# Patient Record
Sex: Male | Born: 1945
Health system: Southern US, Community
[De-identification: ages and names within clinical notes are randomized; demographics above are authoritative.]

## PROBLEM LIST (undated history)

## (undated) DIAGNOSIS — Z6828 Body mass index (BMI) 28.0-28.9, adult: Secondary | ICD-10-CM

## (undated) DIAGNOSIS — M109 Gout, unspecified: Secondary | ICD-10-CM

## (undated) DIAGNOSIS — I10 Essential (primary) hypertension: Secondary | ICD-10-CM

## (undated) DIAGNOSIS — E785 Hyperlipidemia, unspecified: Secondary | ICD-10-CM

## (undated) HISTORY — PX: KNEE SURGERY: SHX244

## (undated) HISTORY — DX: Essential (primary) hypertension: I10

## (undated) HISTORY — PX: OTHER SURGICAL HISTORY: SHX169

## (undated) HISTORY — DX: Body mass index (BMI) 28.0-28.9, adult: Z68.28

## (undated) HISTORY — DX: Hyperlipidemia, unspecified: E78.5

## (undated) HISTORY — DX: Gout, unspecified: M10.9

---

## 1998-12-12 ENCOUNTER — Encounter: Payer: Self-pay | Admitting: Emergency Medicine

## 1998-12-12 ENCOUNTER — Emergency Department (HOSPITAL_COMMUNITY): Admission: EM | Admit: 1998-12-12 | Discharge: 1998-12-12 | Payer: Self-pay | Admitting: Emergency Medicine

## 1999-09-02 ENCOUNTER — Emergency Department (HOSPITAL_COMMUNITY): Admission: EM | Admit: 1999-09-02 | Discharge: 1999-09-02 | Payer: Self-pay | Admitting: *Deleted

## 1999-09-08 ENCOUNTER — Emergency Department (HOSPITAL_COMMUNITY): Admission: EM | Admit: 1999-09-08 | Discharge: 1999-09-08 | Payer: Self-pay | Admitting: Emergency Medicine

## 2006-03-21 ENCOUNTER — Ambulatory Visit: Payer: Self-pay

## 2006-03-29 ENCOUNTER — Emergency Department (HOSPITAL_COMMUNITY): Admission: EM | Admit: 2006-03-29 | Discharge: 2006-03-30 | Payer: Self-pay | Admitting: Emergency Medicine

## 2008-11-01 ENCOUNTER — Emergency Department (HOSPITAL_COMMUNITY): Admission: EM | Admit: 2008-11-01 | Discharge: 2008-11-01 | Payer: Self-pay | Admitting: Family Medicine

## 2011-04-19 DIAGNOSIS — J0100 Acute maxillary sinusitis, unspecified: Secondary | ICD-10-CM | POA: Diagnosis not present

## 2011-07-20 DIAGNOSIS — H10029 Other mucopurulent conjunctivitis, unspecified eye: Secondary | ICD-10-CM | POA: Diagnosis not present

## 2011-07-20 DIAGNOSIS — H5710 Ocular pain, unspecified eye: Secondary | ICD-10-CM | POA: Diagnosis not present

## 2011-08-05 DIAGNOSIS — M25579 Pain in unspecified ankle and joints of unspecified foot: Secondary | ICD-10-CM | POA: Diagnosis not present

## 2011-08-05 DIAGNOSIS — R229 Localized swelling, mass and lump, unspecified: Secondary | ICD-10-CM | POA: Diagnosis not present

## 2011-08-05 DIAGNOSIS — M109 Gout, unspecified: Secondary | ICD-10-CM | POA: Diagnosis not present

## 2012-06-05 DIAGNOSIS — L820 Inflamed seborrheic keratosis: Secondary | ICD-10-CM | POA: Diagnosis not present

## 2012-06-05 DIAGNOSIS — L723 Sebaceous cyst: Secondary | ICD-10-CM | POA: Diagnosis not present

## 2013-04-07 DIAGNOSIS — S5000XA Contusion of unspecified elbow, initial encounter: Secondary | ICD-10-CM | POA: Diagnosis not present

## 2013-04-07 DIAGNOSIS — M7020 Olecranon bursitis, unspecified elbow: Secondary | ICD-10-CM | POA: Diagnosis not present

## 2013-04-07 DIAGNOSIS — IMO0002 Reserved for concepts with insufficient information to code with codable children: Secondary | ICD-10-CM | POA: Diagnosis not present

## 2013-04-09 DIAGNOSIS — M7020 Olecranon bursitis, unspecified elbow: Secondary | ICD-10-CM | POA: Diagnosis not present

## 2013-05-01 DIAGNOSIS — M109 Gout, unspecified: Secondary | ICD-10-CM | POA: Diagnosis not present

## 2013-06-02 DIAGNOSIS — R040 Epistaxis: Secondary | ICD-10-CM | POA: Diagnosis not present

## 2013-06-10 DIAGNOSIS — J019 Acute sinusitis, unspecified: Secondary | ICD-10-CM | POA: Diagnosis not present

## 2013-07-28 DIAGNOSIS — L0390 Cellulitis, unspecified: Secondary | ICD-10-CM | POA: Diagnosis not present

## 2013-07-28 DIAGNOSIS — M7020 Olecranon bursitis, unspecified elbow: Secondary | ICD-10-CM | POA: Diagnosis not present

## 2013-07-28 DIAGNOSIS — L0291 Cutaneous abscess, unspecified: Secondary | ICD-10-CM | POA: Diagnosis not present

## 2013-07-28 DIAGNOSIS — M109 Gout, unspecified: Secondary | ICD-10-CM | POA: Diagnosis not present

## 2013-07-30 DIAGNOSIS — L0291 Cutaneous abscess, unspecified: Secondary | ICD-10-CM | POA: Diagnosis not present

## 2013-07-30 DIAGNOSIS — L0390 Cellulitis, unspecified: Secondary | ICD-10-CM | POA: Diagnosis not present

## 2013-07-30 DIAGNOSIS — M109 Gout, unspecified: Secondary | ICD-10-CM | POA: Diagnosis not present

## 2013-09-24 DIAGNOSIS — M109 Gout, unspecified: Secondary | ICD-10-CM | POA: Diagnosis not present

## 2013-11-26 DIAGNOSIS — M109 Gout, unspecified: Secondary | ICD-10-CM | POA: Diagnosis not present

## 2013-11-30 DIAGNOSIS — M1000 Idiopathic gout, unspecified site: Secondary | ICD-10-CM | POA: Diagnosis not present

## 2013-11-30 DIAGNOSIS — Z23 Encounter for immunization: Secondary | ICD-10-CM | POA: Diagnosis not present

## 2013-11-30 DIAGNOSIS — M109 Gout, unspecified: Secondary | ICD-10-CM | POA: Diagnosis not present

## 2013-11-30 DIAGNOSIS — R739 Hyperglycemia, unspecified: Secondary | ICD-10-CM | POA: Diagnosis not present

## 2013-11-30 DIAGNOSIS — I10 Essential (primary) hypertension: Secondary | ICD-10-CM | POA: Diagnosis not present

## 2013-12-14 DIAGNOSIS — R7309 Other abnormal glucose: Secondary | ICD-10-CM | POA: Diagnosis not present

## 2013-12-14 DIAGNOSIS — I10 Essential (primary) hypertension: Secondary | ICD-10-CM | POA: Diagnosis not present

## 2013-12-14 DIAGNOSIS — M1000 Idiopathic gout, unspecified site: Secondary | ICD-10-CM | POA: Diagnosis not present

## 2014-01-26 DIAGNOSIS — I10 Essential (primary) hypertension: Secondary | ICD-10-CM | POA: Diagnosis not present

## 2014-01-26 DIAGNOSIS — M7020 Olecranon bursitis, unspecified elbow: Secondary | ICD-10-CM | POA: Diagnosis not present

## 2014-01-26 DIAGNOSIS — R7989 Other specified abnormal findings of blood chemistry: Secondary | ICD-10-CM | POA: Diagnosis not present

## 2014-01-26 DIAGNOSIS — M109 Gout, unspecified: Secondary | ICD-10-CM | POA: Diagnosis not present

## 2014-04-05 DIAGNOSIS — N429 Disorder of prostate, unspecified: Secondary | ICD-10-CM | POA: Diagnosis not present

## 2014-04-05 DIAGNOSIS — I10 Essential (primary) hypertension: Secondary | ICD-10-CM | POA: Diagnosis not present

## 2014-04-05 DIAGNOSIS — R7989 Other specified abnormal findings of blood chemistry: Secondary | ICD-10-CM | POA: Diagnosis not present

## 2014-04-05 DIAGNOSIS — M109 Gout, unspecified: Secondary | ICD-10-CM | POA: Diagnosis not present

## 2014-04-05 DIAGNOSIS — Z136 Encounter for screening for cardiovascular disorders: Secondary | ICD-10-CM | POA: Diagnosis not present

## 2014-04-05 DIAGNOSIS — R739 Hyperglycemia, unspecified: Secondary | ICD-10-CM | POA: Diagnosis not present

## 2014-04-05 DIAGNOSIS — Z8042 Family history of malignant neoplasm of prostate: Secondary | ICD-10-CM | POA: Diagnosis not present

## 2014-04-06 DIAGNOSIS — M109 Gout, unspecified: Secondary | ICD-10-CM | POA: Diagnosis not present

## 2014-04-06 DIAGNOSIS — Z136 Encounter for screening for cardiovascular disorders: Secondary | ICD-10-CM | POA: Diagnosis not present

## 2014-04-06 DIAGNOSIS — I10 Essential (primary) hypertension: Secondary | ICD-10-CM | POA: Diagnosis not present

## 2014-04-06 DIAGNOSIS — E559 Vitamin D deficiency, unspecified: Secondary | ICD-10-CM | POA: Diagnosis not present

## 2014-07-06 DIAGNOSIS — M109 Gout, unspecified: Secondary | ICD-10-CM | POA: Diagnosis not present

## 2014-07-06 DIAGNOSIS — E559 Vitamin D deficiency, unspecified: Secondary | ICD-10-CM | POA: Diagnosis not present

## 2014-07-07 DIAGNOSIS — I10 Essential (primary) hypertension: Secondary | ICD-10-CM | POA: Diagnosis not present

## 2014-07-07 DIAGNOSIS — M109 Gout, unspecified: Secondary | ICD-10-CM | POA: Diagnosis not present

## 2014-07-07 DIAGNOSIS — E559 Vitamin D deficiency, unspecified: Secondary | ICD-10-CM | POA: Diagnosis not present

## 2014-10-05 DIAGNOSIS — E559 Vitamin D deficiency, unspecified: Secondary | ICD-10-CM | POA: Diagnosis not present

## 2014-10-06 DIAGNOSIS — R739 Hyperglycemia, unspecified: Secondary | ICD-10-CM | POA: Diagnosis not present

## 2014-10-06 DIAGNOSIS — M109 Gout, unspecified: Secondary | ICD-10-CM | POA: Diagnosis not present

## 2014-10-06 DIAGNOSIS — Z23 Encounter for immunization: Secondary | ICD-10-CM | POA: Diagnosis not present

## 2014-10-06 DIAGNOSIS — E559 Vitamin D deficiency, unspecified: Secondary | ICD-10-CM | POA: Diagnosis not present

## 2014-10-06 DIAGNOSIS — R635 Abnormal weight gain: Secondary | ICD-10-CM | POA: Diagnosis not present

## 2014-10-06 DIAGNOSIS — Z136 Encounter for screening for cardiovascular disorders: Secondary | ICD-10-CM | POA: Diagnosis not present

## 2014-10-06 DIAGNOSIS — I10 Essential (primary) hypertension: Secondary | ICD-10-CM | POA: Diagnosis not present

## 2014-12-07 DIAGNOSIS — R220 Localized swelling, mass and lump, head: Secondary | ICD-10-CM | POA: Diagnosis not present

## 2014-12-07 DIAGNOSIS — K119 Disease of salivary gland, unspecified: Secondary | ICD-10-CM | POA: Diagnosis not present

## 2014-12-08 DIAGNOSIS — R220 Localized swelling, mass and lump, head: Secondary | ICD-10-CM | POA: Diagnosis not present

## 2014-12-08 DIAGNOSIS — K119 Disease of salivary gland, unspecified: Secondary | ICD-10-CM | POA: Diagnosis not present

## 2014-12-28 DIAGNOSIS — M109 Gout, unspecified: Secondary | ICD-10-CM | POA: Diagnosis not present

## 2014-12-28 DIAGNOSIS — I10 Essential (primary) hypertension: Secondary | ICD-10-CM | POA: Diagnosis not present

## 2014-12-28 DIAGNOSIS — K219 Gastro-esophageal reflux disease without esophagitis: Secondary | ICD-10-CM | POA: Diagnosis not present

## 2014-12-28 DIAGNOSIS — K119 Disease of salivary gland, unspecified: Secondary | ICD-10-CM | POA: Diagnosis not present

## 2015-01-20 DIAGNOSIS — J069 Acute upper respiratory infection, unspecified: Secondary | ICD-10-CM | POA: Diagnosis not present

## 2015-01-27 DIAGNOSIS — K219 Gastro-esophageal reflux disease without esophagitis: Secondary | ICD-10-CM | POA: Diagnosis not present

## 2015-01-27 DIAGNOSIS — R220 Localized swelling, mass and lump, head: Secondary | ICD-10-CM | POA: Diagnosis not present

## 2015-01-27 DIAGNOSIS — I10 Essential (primary) hypertension: Secondary | ICD-10-CM | POA: Diagnosis not present

## 2015-04-06 DIAGNOSIS — I10 Essential (primary) hypertension: Secondary | ICD-10-CM | POA: Diagnosis not present

## 2015-04-06 DIAGNOSIS — M109 Gout, unspecified: Secondary | ICD-10-CM | POA: Diagnosis not present

## 2015-04-07 DIAGNOSIS — I10 Essential (primary) hypertension: Secondary | ICD-10-CM | POA: Diagnosis not present

## 2015-04-07 DIAGNOSIS — M25512 Pain in left shoulder: Secondary | ICD-10-CM | POA: Diagnosis not present

## 2015-04-07 DIAGNOSIS — M7542 Impingement syndrome of left shoulder: Secondary | ICD-10-CM | POA: Diagnosis not present

## 2015-04-07 DIAGNOSIS — M109 Gout, unspecified: Secondary | ICD-10-CM | POA: Diagnosis not present

## 2015-04-13 DIAGNOSIS — M7502 Adhesive capsulitis of left shoulder: Secondary | ICD-10-CM | POA: Diagnosis not present

## 2015-04-13 DIAGNOSIS — M25512 Pain in left shoulder: Secondary | ICD-10-CM | POA: Diagnosis not present

## 2015-04-14 DIAGNOSIS — M7502 Adhesive capsulitis of left shoulder: Secondary | ICD-10-CM | POA: Diagnosis not present

## 2015-04-18 DIAGNOSIS — E875 Hyperkalemia: Secondary | ICD-10-CM | POA: Diagnosis not present

## 2015-04-19 DIAGNOSIS — M7502 Adhesive capsulitis of left shoulder: Secondary | ICD-10-CM | POA: Diagnosis not present

## 2015-04-20 DIAGNOSIS — I10 Essential (primary) hypertension: Secondary | ICD-10-CM | POA: Diagnosis not present

## 2015-04-20 DIAGNOSIS — M1000 Idiopathic gout, unspecified site: Secondary | ICD-10-CM | POA: Diagnosis not present

## 2015-04-20 DIAGNOSIS — M25512 Pain in left shoulder: Secondary | ICD-10-CM | POA: Diagnosis not present

## 2015-04-22 DIAGNOSIS — M7502 Adhesive capsulitis of left shoulder: Secondary | ICD-10-CM | POA: Diagnosis not present

## 2015-05-10 DIAGNOSIS — M7502 Adhesive capsulitis of left shoulder: Secondary | ICD-10-CM | POA: Diagnosis not present

## 2015-05-10 DIAGNOSIS — M25512 Pain in left shoulder: Secondary | ICD-10-CM | POA: Diagnosis not present

## 2015-09-19 DIAGNOSIS — R7989 Other specified abnormal findings of blood chemistry: Secondary | ICD-10-CM | POA: Diagnosis not present

## 2015-09-19 DIAGNOSIS — I10 Essential (primary) hypertension: Secondary | ICD-10-CM | POA: Diagnosis not present

## 2015-09-19 DIAGNOSIS — M109 Gout, unspecified: Secondary | ICD-10-CM | POA: Diagnosis not present

## 2015-09-22 DIAGNOSIS — Z23 Encounter for immunization: Secondary | ICD-10-CM | POA: Diagnosis not present

## 2015-09-22 DIAGNOSIS — E559 Vitamin D deficiency, unspecified: Secondary | ICD-10-CM | POA: Diagnosis not present

## 2015-09-22 DIAGNOSIS — Z0000 Encounter for general adult medical examination without abnormal findings: Secondary | ICD-10-CM | POA: Diagnosis not present

## 2015-09-22 DIAGNOSIS — M109 Gout, unspecified: Secondary | ICD-10-CM | POA: Diagnosis not present

## 2015-09-22 DIAGNOSIS — N289 Disorder of kidney and ureter, unspecified: Secondary | ICD-10-CM | POA: Diagnosis not present

## 2015-09-22 DIAGNOSIS — I10 Essential (primary) hypertension: Secondary | ICD-10-CM | POA: Diagnosis not present

## 2015-09-23 ENCOUNTER — Encounter: Payer: Self-pay | Admitting: Gastroenterology

## 2015-09-26 ENCOUNTER — Ambulatory Visit (AMBULATORY_SURGERY_CENTER): Payer: Self-pay

## 2015-09-26 ENCOUNTER — Encounter: Payer: Self-pay | Admitting: Gastroenterology

## 2015-09-26 VITALS — Ht 61.0 in | Wt 199.2 lb

## 2015-09-26 DIAGNOSIS — Z1211 Encounter for screening for malignant neoplasm of colon: Secondary | ICD-10-CM

## 2015-09-26 MED ORDER — NA SULFATE-K SULFATE-MG SULF 17.5-3.13-1.6 GM/177ML PO SOLN: ORAL | 0 refills | Status: DC

## 2015-09-26 NOTE — Progress Notes (Signed)
Per pt, no allergies to soy or egg products.Pt not taking any weight loss meds or using  O2 at home. 

## 2015-10-05 DIAGNOSIS — I10 Essential (primary) hypertension: Secondary | ICD-10-CM | POA: Diagnosis not present

## 2015-10-07 DIAGNOSIS — N289 Disorder of kidney and ureter, unspecified: Secondary | ICD-10-CM | POA: Diagnosis not present

## 2015-10-07 DIAGNOSIS — E860 Dehydration: Secondary | ICD-10-CM | POA: Diagnosis not present

## 2015-10-07 DIAGNOSIS — R7989 Other specified abnormal findings of blood chemistry: Secondary | ICD-10-CM | POA: Diagnosis not present

## 2015-10-07 DIAGNOSIS — I10 Essential (primary) hypertension: Secondary | ICD-10-CM | POA: Diagnosis not present

## 2015-10-10 ENCOUNTER — Ambulatory Visit (AMBULATORY_SURGERY_CENTER): Payer: PPO | Admitting: Gastroenterology

## 2015-10-10 ENCOUNTER — Encounter: Payer: Self-pay | Admitting: Gastroenterology

## 2015-10-10 VITALS — BP 130/74 | HR 62 | Temp 98.0°F | Resp 11 | Ht 70.0 in | Wt 243.0 lb

## 2015-10-10 DIAGNOSIS — K635 Polyp of colon: Secondary | ICD-10-CM

## 2015-10-10 DIAGNOSIS — D125 Benign neoplasm of sigmoid colon: Secondary | ICD-10-CM

## 2015-10-10 DIAGNOSIS — Z1211 Encounter for screening for malignant neoplasm of colon: Secondary | ICD-10-CM

## 2015-10-10 DIAGNOSIS — I10 Essential (primary) hypertension: Secondary | ICD-10-CM | POA: Diagnosis not present

## 2015-10-10 MED ORDER — SODIUM CHLORIDE 0.9 % IV SOLN: 500.0000 mL | INTRAVENOUS | Status: DC

## 2015-10-10 NOTE — Progress Notes (Signed)
Called to room to assist during endoscopic procedure.  Patient ID and intended procedure confirmed with present staff. Received instructions for my participation in the procedure from the performing physician.  

## 2015-10-10 NOTE — Op Note (Signed)
[facility] Patient Name: Frank Foster Procedure Date: 10/10/2015 11:18 AM MRN: [id number] Endoscopist: Ladene Artist , MD Age: 70 Referring MD:  Date of Birth: [birth year] Gender: Male Account #: [id number] Procedure:                Colonoscopy Indications:              Screening for colorectal malignant neoplasm Medicines:                Monitored Anesthesia Care Procedure:                Pre-Anesthesia Assessment:                           - Prior to the procedure, a History and Physical                            was performed, and patient medications and                            allergies were reviewed. The patient's tolerance of                            previous anesthesia was also reviewed. The risks                            and benefits of the procedure and the sedation                            options and risks were discussed with the patient.                            All questions were answered, and informed consent                            was obtained. Prior Anticoagulants: The patient has                            taken no previous anticoagulant or antiplatelet                            agents. ASA Grade Assessment: II - A patient with                            mild systemic disease. After reviewing the risks                            and benefits, the patient was deemed in                            satisfactory condition to undergo the procedure.                           After obtaining informed consent, the colonoscope  was passed under direct vision. Throughout the                            procedure, the patient's blood pressure, pulse, and                            oxygen saturations were monitored continuously. The                            Model PCF-[id number] scope was introduced                            through the anus and advanced to the the cecum,                            identified by  appendiceal orifice and ileocecal                            valve. The ileocecal valve, appendiceal orifice,                            and rectum were photographed. The quality of the                            bowel preparation was good. The colonoscopy was                            performed without difficulty. The patient tolerated                            the procedure well. Scope In: 11:31:12 AM Scope Out: [id number] AM Scope Withdrawal Time: 0 hours 13 minutes 37 seconds  Total Procedure Duration: 0 hours 14 minutes 59 seconds  Findings:                 Three sessile polyps were found in the sigmoid                            colon. The polyps were 5 to 6 mm in size. These                            polyps were removed with a cold snare. Resection                            and retrieval were complete.                           The exam was otherwise without abnormality on                            direct and retroflexion views.                           A few small-mouthed diverticula were found in the  sigmoid colon. Complications:            No immediate complications. Estimated blood loss:                            None. Estimated Blood Loss:     Estimated blood loss: none. Impression:               - Three 5 to 6 mm polyps in the sigmoid colon,                            removed with a cold snare. Resected and retrieved.                           - The examination was otherwise normal on direct                            and retroflexion views.                           - Diverticulosis in the sigmoid colon. Recommendation:           - Repeat colonoscopy in 5 years for surveillance if                            polyp(s) are precancerous, otherwise 10 years.                           - Patient has a contact number available for                            emergencies. The signs and symptoms of potential                            delayed  complications were discussed with the                            patient. Return to normal activities tomorrow.                            Written discharge instructions were provided to the                            patient.                           - High fiber diet.                           - Continue present medications.                           - Await pathology results. Ladene Artist, MD 10/10/2015 11:50:02 AM This report has been signed electronically.

## 2015-10-10 NOTE — Patient Instructions (Signed)
YOU HAD AN ENDOSCOPIC PROCEDURE TODAY AT THE [facility]:   Refer to the procedure report that was given to you for any specific questions about what was found during the examination.  If the procedure report does not answer your questions, please call your gastroenterologist to clarify.  If you requested that your care partner not be given the details of your procedure findings, then the procedure report has been included in a sealed envelope for you to review at your convenience later.  YOU SHOULD EXPECT: Some feelings of bloating in the abdomen. Passage of more gas than usual.  Walking can help get rid of the air that was put into your GI tract during the procedure and reduce the bloating. If you had a lower endoscopy (such as a colonoscopy or flexible sigmoidoscopy) you may notice spotting of blood in your stool or on the toilet paper. If you underwent a bowel prep for your procedure, you may not have a normal bowel movement for a few days.  Please Note:  You might notice some irritation and congestion in your nose or some drainage.  This is from the oxygen used during your procedure.  There is no need for concern and it should clear up in a day or so.  SYMPTOMS TO REPORT IMMEDIATELY:   Following lower endoscopy (colonoscopy or flexible sigmoidoscopy):  Excessive amounts of blood in the stool  Significant tenderness or worsening of abdominal pains  Swelling of the abdomen that is new, acute  Fever of 100F or higher    For urgent or emergent issues, a gastroenterologist can be reached at any hour by calling [phone].   DIET:  We do recommend a small meal at first, but then you may proceed to your regular diet.  Drink plenty of fluids but you should avoid alcoholic beverages for 24 hours.  ACTIVITY:  You should plan to take it easy for the rest of today and you should NOT DRIVE or use heavy machinery until tomorrow (because of the sedation medicines used during the test).     FOLLOW UP: Our staff will call the number listed on your records the next business day following your procedure to check on you and address any questions or concerns that you may have regarding the information given to you following your procedure. If we do not reach you, we will leave a message.  However, if you are feeling well and you are not experiencing any problems, there is no need to return our call.  We will assume that you have returned to your regular daily activities without incident.  If any biopsies were taken you will be contacted by phone or by letter within the next 1-3 weeks.  Please call us at [phone] if you have not heard about the biopsies in 3 weeks.    SIGNATURES/CONFIDENTIALITY: You and/or your care partner have signed paperwork which will be entered into your electronic medical record.  These signatures attest to the fact that that the information above on your After Visit Summary has been reviewed and is understood.  Full responsibility of the confidentiality of this discharge information lies with you and/or your care-partner.   Resume medications. Information given on polyps,diverticulosis and high fiber diet. 

## 2015-10-10 NOTE — Progress Notes (Signed)
To PACU awake and alert. Report to RN 

## 2015-10-11 ENCOUNTER — Telehealth: Payer: Self-pay

## 2015-10-11 NOTE — Telephone Encounter (Signed)
  Follow up Call-  Call back number 10/10/2015  Post procedure Call Back phone  # [phone]  Permission to leave phone message Yes  Some recent data might be hidden     Patient questions:  Do you have a fever, pain , or abdominal swelling? No. Pain Score  0 *  Have you tolerated food without any problems? Yes.    Have you been able to return to your normal activities? Yes.    Do you have any questions about your discharge instructions: Diet   No. Medications  No. Follow up visit  No.  Do you have questions or concerns about your Care? No.  Actions: * If pain score is 4 or above: No action needed, pain <4.

## 2015-10-18 ENCOUNTER — Encounter: Payer: Self-pay | Admitting: Gastroenterology

## 2015-10-31 DIAGNOSIS — C44712 Basal cell carcinoma of skin of right lower limb, including hip: Secondary | ICD-10-CM | POA: Diagnosis not present

## 2015-10-31 DIAGNOSIS — D485 Neoplasm of uncertain behavior of skin: Secondary | ICD-10-CM | POA: Diagnosis not present

## 2015-10-31 DIAGNOSIS — L570 Actinic keratosis: Secondary | ICD-10-CM | POA: Diagnosis not present

## 2015-10-31 DIAGNOSIS — Z23 Encounter for immunization: Secondary | ICD-10-CM | POA: Diagnosis not present

## 2015-11-21 DIAGNOSIS — C44712 Basal cell carcinoma of skin of right lower limb, including hip: Secondary | ICD-10-CM | POA: Diagnosis not present

## 2015-11-21 DIAGNOSIS — L821 Other seborrheic keratosis: Secondary | ICD-10-CM | POA: Diagnosis not present

## 2016-03-06 DIAGNOSIS — I10 Essential (primary) hypertension: Secondary | ICD-10-CM | POA: Diagnosis not present

## 2016-03-06 DIAGNOSIS — R220 Localized swelling, mass and lump, head: Secondary | ICD-10-CM | POA: Diagnosis not present

## 2016-03-06 DIAGNOSIS — Z6828 Body mass index (BMI) 28.0-28.9, adult: Secondary | ICD-10-CM | POA: Diagnosis not present

## 2016-03-06 DIAGNOSIS — K1120 Sialoadenitis, unspecified: Secondary | ICD-10-CM | POA: Diagnosis not present

## 2016-03-08 DIAGNOSIS — Z6828 Body mass index (BMI) 28.0-28.9, adult: Secondary | ICD-10-CM | POA: Diagnosis not present

## 2016-03-08 DIAGNOSIS — K1120 Sialoadenitis, unspecified: Secondary | ICD-10-CM | POA: Diagnosis not present

## 2016-03-08 DIAGNOSIS — I10 Essential (primary) hypertension: Secondary | ICD-10-CM | POA: Diagnosis not present

## 2016-03-08 DIAGNOSIS — M109 Gout, unspecified: Secondary | ICD-10-CM | POA: Diagnosis not present

## 2016-03-12 DIAGNOSIS — D1801 Hemangioma of skin and subcutaneous tissue: Secondary | ICD-10-CM | POA: Diagnosis not present

## 2016-03-12 DIAGNOSIS — L570 Actinic keratosis: Secondary | ICD-10-CM | POA: Diagnosis not present

## 2016-03-12 DIAGNOSIS — D225 Melanocytic nevi of trunk: Secondary | ICD-10-CM | POA: Diagnosis not present

## 2016-03-12 DIAGNOSIS — L821 Other seborrheic keratosis: Secondary | ICD-10-CM | POA: Diagnosis not present

## 2016-03-12 DIAGNOSIS — L814 Other melanin hyperpigmentation: Secondary | ICD-10-CM | POA: Diagnosis not present

## 2016-03-12 DIAGNOSIS — Z23 Encounter for immunization: Secondary | ICD-10-CM | POA: Diagnosis not present

## 2016-03-12 DIAGNOSIS — Z85828 Personal history of other malignant neoplasm of skin: Secondary | ICD-10-CM | POA: Diagnosis not present

## 2016-05-08 DIAGNOSIS — H1032 Unspecified acute conjunctivitis, left eye: Secondary | ICD-10-CM | POA: Diagnosis not present

## 2016-05-31 DIAGNOSIS — H5201 Hypermetropia, right eye: Secondary | ICD-10-CM | POA: Diagnosis not present

## 2016-06-18 DIAGNOSIS — H16202 Unspecified keratoconjunctivitis, left eye: Secondary | ICD-10-CM | POA: Diagnosis not present

## 2016-08-22 DIAGNOSIS — H16402 Unspecified corneal neovascularization, left eye: Secondary | ICD-10-CM | POA: Diagnosis not present

## 2016-09-25 DIAGNOSIS — Z6829 Body mass index (BMI) 29.0-29.9, adult: Secondary | ICD-10-CM | POA: Diagnosis not present

## 2016-09-25 DIAGNOSIS — Z0000 Encounter for general adult medical examination without abnormal findings: Secondary | ICD-10-CM | POA: Diagnosis not present

## 2016-09-25 DIAGNOSIS — Z23 Encounter for immunization: Secondary | ICD-10-CM | POA: Diagnosis not present

## 2016-10-23 DIAGNOSIS — I10 Essential (primary) hypertension: Secondary | ICD-10-CM | POA: Diagnosis not present

## 2016-10-23 DIAGNOSIS — M109 Gout, unspecified: Secondary | ICD-10-CM | POA: Diagnosis not present

## 2016-10-23 DIAGNOSIS — E782 Mixed hyperlipidemia: Secondary | ICD-10-CM | POA: Diagnosis not present

## 2016-10-25 DIAGNOSIS — E782 Mixed hyperlipidemia: Secondary | ICD-10-CM | POA: Diagnosis not present

## 2016-10-25 DIAGNOSIS — Z23 Encounter for immunization: Secondary | ICD-10-CM | POA: Diagnosis not present

## 2016-10-25 DIAGNOSIS — R739 Hyperglycemia, unspecified: Secondary | ICD-10-CM | POA: Diagnosis not present

## 2016-10-25 DIAGNOSIS — I10 Essential (primary) hypertension: Secondary | ICD-10-CM | POA: Diagnosis not present

## 2016-10-25 DIAGNOSIS — M109 Gout, unspecified: Secondary | ICD-10-CM | POA: Diagnosis not present

## 2016-11-02 ENCOUNTER — Other Ambulatory Visit: Payer: Self-pay | Admitting: Family Medicine

## 2016-11-02 DIAGNOSIS — Z122 Encounter for screening for malignant neoplasm of respiratory organs: Secondary | ICD-10-CM

## 2016-11-13 ENCOUNTER — Ambulatory Visit
Admission: RE | Admit: 2016-11-13 | Discharge: 2016-11-13 | Disposition: A | Discharge status: Home / Self Care | Payer: PPO | Source: Ambulatory Visit | Attending: Family Medicine | Admitting: Family Medicine

## 2016-11-13 DIAGNOSIS — Z122 Encounter for screening for malignant neoplasm of respiratory organs: Secondary | ICD-10-CM

## 2016-11-13 DIAGNOSIS — Z87891 Personal history of nicotine dependence: Secondary | ICD-10-CM | POA: Diagnosis not present

## 2017-01-09 DIAGNOSIS — I872 Venous insufficiency (chronic) (peripheral): Secondary | ICD-10-CM | POA: Diagnosis not present

## 2017-01-09 DIAGNOSIS — Z23 Encounter for immunization: Secondary | ICD-10-CM | POA: Diagnosis not present

## 2017-01-09 DIAGNOSIS — D485 Neoplasm of uncertain behavior of skin: Secondary | ICD-10-CM | POA: Diagnosis not present

## 2017-01-09 DIAGNOSIS — L570 Actinic keratosis: Secondary | ICD-10-CM | POA: Diagnosis not present

## 2017-02-05 DIAGNOSIS — I10 Essential (primary) hypertension: Secondary | ICD-10-CM | POA: Diagnosis not present

## 2017-02-05 DIAGNOSIS — E782 Mixed hyperlipidemia: Secondary | ICD-10-CM | POA: Diagnosis not present

## 2017-02-07 DIAGNOSIS — Z6829 Body mass index (BMI) 29.0-29.9, adult: Secondary | ICD-10-CM | POA: Diagnosis not present

## 2017-02-07 DIAGNOSIS — I10 Essential (primary) hypertension: Secondary | ICD-10-CM | POA: Diagnosis not present

## 2017-02-07 DIAGNOSIS — R739 Hyperglycemia, unspecified: Secondary | ICD-10-CM | POA: Diagnosis not present

## 2017-02-07 DIAGNOSIS — E782 Mixed hyperlipidemia: Secondary | ICD-10-CM | POA: Diagnosis not present

## 2017-03-12 DIAGNOSIS — D225 Melanocytic nevi of trunk: Secondary | ICD-10-CM | POA: Diagnosis not present

## 2017-03-12 DIAGNOSIS — D1801 Hemangioma of skin and subcutaneous tissue: Secondary | ICD-10-CM | POA: Diagnosis not present

## 2017-03-12 DIAGNOSIS — L821 Other seborrheic keratosis: Secondary | ICD-10-CM | POA: Diagnosis not present

## 2017-03-12 DIAGNOSIS — L570 Actinic keratosis: Secondary | ICD-10-CM | POA: Diagnosis not present

## 2017-03-12 DIAGNOSIS — Z85828 Personal history of other malignant neoplasm of skin: Secondary | ICD-10-CM | POA: Diagnosis not present

## 2017-03-12 DIAGNOSIS — L814 Other melanin hyperpigmentation: Secondary | ICD-10-CM | POA: Diagnosis not present

## 2017-03-12 DIAGNOSIS — D485 Neoplasm of uncertain behavior of skin: Secondary | ICD-10-CM | POA: Diagnosis not present

## 2017-03-12 DIAGNOSIS — R234 Changes in skin texture: Secondary | ICD-10-CM | POA: Diagnosis not present

## 2017-03-12 DIAGNOSIS — D2271 Melanocytic nevi of right lower limb, including hip: Secondary | ICD-10-CM | POA: Diagnosis not present

## 2017-03-12 DIAGNOSIS — Z23 Encounter for immunization: Secondary | ICD-10-CM | POA: Diagnosis not present

## 2017-03-19 DIAGNOSIS — Z6829 Body mass index (BMI) 29.0-29.9, adult: Secondary | ICD-10-CM | POA: Diagnosis not present

## 2017-03-19 DIAGNOSIS — M109 Gout, unspecified: Secondary | ICD-10-CM | POA: Diagnosis not present

## 2017-03-19 DIAGNOSIS — I10 Essential (primary) hypertension: Secondary | ICD-10-CM | POA: Diagnosis not present

## 2017-03-19 DIAGNOSIS — M1712 Unilateral primary osteoarthritis, left knee: Secondary | ICD-10-CM | POA: Diagnosis not present

## 2017-03-25 DIAGNOSIS — Z6827 Body mass index (BMI) 27.0-27.9, adult: Secondary | ICD-10-CM | POA: Diagnosis not present

## 2017-03-25 DIAGNOSIS — J069 Acute upper respiratory infection, unspecified: Secondary | ICD-10-CM | POA: Diagnosis not present

## 2017-03-26 ENCOUNTER — Ambulatory Visit
Admission: RE | Admit: 2017-03-26 | Discharge: 2017-03-26 | Disposition: A | Discharge status: Home / Self Care | Payer: PPO | Source: Ambulatory Visit | Attending: Family Medicine | Admitting: Family Medicine

## 2017-03-26 ENCOUNTER — Other Ambulatory Visit: Payer: Self-pay | Admitting: Family Medicine

## 2017-03-26 DIAGNOSIS — I10 Essential (primary) hypertension: Secondary | ICD-10-CM | POA: Diagnosis not present

## 2017-03-26 DIAGNOSIS — R05 Cough: Secondary | ICD-10-CM

## 2017-03-26 DIAGNOSIS — Z6827 Body mass index (BMI) 27.0-27.9, adult: Secondary | ICD-10-CM | POA: Diagnosis not present

## 2017-03-26 DIAGNOSIS — J189 Pneumonia, unspecified organism: Secondary | ICD-10-CM | POA: Diagnosis not present

## 2017-03-26 DIAGNOSIS — R739 Hyperglycemia, unspecified: Secondary | ICD-10-CM | POA: Diagnosis not present

## 2017-03-26 DIAGNOSIS — Z23 Encounter for immunization: Secondary | ICD-10-CM | POA: Diagnosis not present

## 2017-03-26 DIAGNOSIS — R059 Cough, unspecified: Secondary | ICD-10-CM

## 2017-03-28 DIAGNOSIS — Z6827 Body mass index (BMI) 27.0-27.9, adult: Secondary | ICD-10-CM | POA: Diagnosis not present

## 2017-03-28 DIAGNOSIS — I10 Essential (primary) hypertension: Secondary | ICD-10-CM | POA: Diagnosis not present

## 2017-03-28 DIAGNOSIS — J189 Pneumonia, unspecified organism: Secondary | ICD-10-CM | POA: Diagnosis not present

## 2017-04-03 DIAGNOSIS — I10 Essential (primary) hypertension: Secondary | ICD-10-CM | POA: Diagnosis not present

## 2017-04-03 DIAGNOSIS — J189 Pneumonia, unspecified organism: Secondary | ICD-10-CM | POA: Diagnosis not present

## 2017-04-03 DIAGNOSIS — R0602 Shortness of breath: Secondary | ICD-10-CM | POA: Diagnosis not present

## 2017-05-08 DIAGNOSIS — R739 Hyperglycemia, unspecified: Secondary | ICD-10-CM | POA: Diagnosis not present

## 2017-05-08 DIAGNOSIS — E782 Mixed hyperlipidemia: Secondary | ICD-10-CM | POA: Diagnosis not present

## 2017-05-10 DIAGNOSIS — E782 Mixed hyperlipidemia: Secondary | ICD-10-CM | POA: Diagnosis not present

## 2017-05-10 DIAGNOSIS — Z6827 Body mass index (BMI) 27.0-27.9, adult: Secondary | ICD-10-CM | POA: Diagnosis not present

## 2017-05-10 DIAGNOSIS — I10 Essential (primary) hypertension: Secondary | ICD-10-CM | POA: Diagnosis not present

## 2017-05-10 DIAGNOSIS — E663 Overweight: Secondary | ICD-10-CM | POA: Diagnosis not present

## 2017-05-10 DIAGNOSIS — R739 Hyperglycemia, unspecified: Secondary | ICD-10-CM | POA: Diagnosis not present

## 2017-07-29 DIAGNOSIS — R232 Flushing: Secondary | ICD-10-CM | POA: Diagnosis not present

## 2017-07-29 DIAGNOSIS — I10 Essential (primary) hypertension: Secondary | ICD-10-CM | POA: Diagnosis not present

## 2017-07-29 DIAGNOSIS — E782 Mixed hyperlipidemia: Secondary | ICD-10-CM | POA: Diagnosis not present

## 2017-07-31 DIAGNOSIS — Z6828 Body mass index (BMI) 28.0-28.9, adult: Secondary | ICD-10-CM | POA: Diagnosis not present

## 2017-07-31 DIAGNOSIS — E782 Mixed hyperlipidemia: Secondary | ICD-10-CM | POA: Diagnosis not present

## 2017-07-31 DIAGNOSIS — I10 Essential (primary) hypertension: Secondary | ICD-10-CM | POA: Diagnosis not present

## 2017-07-31 DIAGNOSIS — E663 Overweight: Secondary | ICD-10-CM | POA: Diagnosis not present

## 2017-08-27 DIAGNOSIS — H5211 Myopia, right eye: Secondary | ICD-10-CM | POA: Diagnosis not present

## 2017-08-27 DIAGNOSIS — H524 Presbyopia: Secondary | ICD-10-CM | POA: Diagnosis not present

## 2017-08-27 DIAGNOSIS — H52222 Regular astigmatism, left eye: Secondary | ICD-10-CM | POA: Diagnosis not present

## 2017-09-24 DIAGNOSIS — L570 Actinic keratosis: Secondary | ICD-10-CM | POA: Diagnosis not present

## 2017-09-24 DIAGNOSIS — L720 Epidermal cyst: Secondary | ICD-10-CM | POA: Diagnosis not present

## 2017-09-24 DIAGNOSIS — L821 Other seborrheic keratosis: Secondary | ICD-10-CM | POA: Diagnosis not present

## 2017-09-24 DIAGNOSIS — L814 Other melanin hyperpigmentation: Secondary | ICD-10-CM | POA: Diagnosis not present

## 2017-10-09 ENCOUNTER — Other Ambulatory Visit: Payer: Self-pay | Admitting: Family Medicine

## 2017-10-09 DIAGNOSIS — Z23 Encounter for immunization: Secondary | ICD-10-CM | POA: Diagnosis not present

## 2017-10-09 DIAGNOSIS — I10 Essential (primary) hypertension: Secondary | ICD-10-CM | POA: Diagnosis not present

## 2017-10-09 DIAGNOSIS — Z0000 Encounter for general adult medical examination without abnormal findings: Secondary | ICD-10-CM | POA: Diagnosis not present

## 2017-10-09 DIAGNOSIS — E7800 Pure hypercholesterolemia, unspecified: Secondary | ICD-10-CM

## 2017-10-09 DIAGNOSIS — I159 Secondary hypertension, unspecified: Secondary | ICD-10-CM

## 2017-10-09 DIAGNOSIS — G4700 Insomnia, unspecified: Secondary | ICD-10-CM | POA: Diagnosis not present

## 2017-10-09 DIAGNOSIS — M109 Gout, unspecified: Secondary | ICD-10-CM | POA: Diagnosis not present

## 2017-10-09 DIAGNOSIS — E782 Mixed hyperlipidemia: Secondary | ICD-10-CM | POA: Diagnosis not present

## 2017-10-09 DIAGNOSIS — Z125 Encounter for screening for malignant neoplasm of prostate: Secondary | ICD-10-CM | POA: Diagnosis not present

## 2017-10-09 DIAGNOSIS — R739 Hyperglycemia, unspecified: Secondary | ICD-10-CM | POA: Diagnosis not present

## 2017-10-17 ENCOUNTER — Ambulatory Visit
Admission: RE | Admit: 2017-10-17 | Discharge: 2017-10-17 | Disposition: A | Discharge status: Home / Self Care | Payer: PPO | Source: Ambulatory Visit | Attending: Family Medicine | Admitting: Family Medicine

## 2017-10-17 ENCOUNTER — Other Ambulatory Visit: Payer: Self-pay | Admitting: Family Medicine

## 2017-10-17 DIAGNOSIS — E785 Hyperlipidemia, unspecified: Secondary | ICD-10-CM | POA: Diagnosis not present

## 2017-10-17 DIAGNOSIS — E7800 Pure hypercholesterolemia, unspecified: Secondary | ICD-10-CM

## 2017-10-17 DIAGNOSIS — I159 Secondary hypertension, unspecified: Secondary | ICD-10-CM

## 2017-10-17 DIAGNOSIS — I6523 Occlusion and stenosis of bilateral carotid arteries: Secondary | ICD-10-CM | POA: Diagnosis not present

## 2017-10-24 DIAGNOSIS — M1000 Idiopathic gout, unspecified site: Secondary | ICD-10-CM | POA: Diagnosis not present

## 2017-10-24 DIAGNOSIS — Z6828 Body mass index (BMI) 28.0-28.9, adult: Secondary | ICD-10-CM | POA: Diagnosis not present

## 2017-10-24 DIAGNOSIS — I10 Essential (primary) hypertension: Secondary | ICD-10-CM | POA: Diagnosis not present

## 2017-10-24 DIAGNOSIS — E782 Mixed hyperlipidemia: Secondary | ICD-10-CM | POA: Diagnosis not present

## 2017-12-31 NOTE — Progress Notes (Signed)
Cardiology Office Note   Date:  01/01/2018   ID:  Frank Foster, DOB 1945/05/03, MRN [id number]  PCP:  Fanny Bien, MD  Cardiologist:   Jenkins Rouge, MD   No chief complaint on file.     History of Present Illness: Frank Foster is a 72 y.o. male who presents for consultation regarding CAD. He is a former smoker quitting in 2015. History of HTN and HLD. Had screening carotid and calcium score done 10/17/17 Carotids with mild plaque no stenosis Calcium score with mild 3 vessel calcium with score of 205 This was incorrectly labeled as 7 th percentile for age and sex as it is only 7 th percentile for age and sex. He has no cardiac symptoms   He has no chest pain. He is somewhat sedentary Walks 3x/week and golfs a bit. Long discussion with Him about his ECG today showing LBBB. No old ECG to compare. 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 Body mass index (bmi) 28.0-28.9, adult   . Gout   . Hyperlipidemia   . Hypertension     Past Surgical History:  Procedure Laterality Date  . broken nose    . KNEE SURGERY     arthroscopic right knee     Current Outpatient Medications  Medication Sig Dispense Refill  . allopurinol (ZYLOPRIM) 100 MG tablet Take 100 mg by mouth daily.    . colchicine 0.6 MG tablet Take 0.6 mg by mouth as needed (FOR GOUT FLARE UPS).     Marland Kitchen losartan (COZAAR) 25 MG tablet Take 25 mg by mouth daily.     . rosuvastatin (CRESTOR) 5 MG tablet Take 5 mg by mouth daily.     Current Facility-Administered Medications  Medication Dose Route Frequency Provider Last Rate Last Dose  . 0.9 %  sodium chloride infusion  500 mL Intravenous Continuous Ladene Artist, MD        Allergies:   Penicillins    Social History:  The patient  reports that he quit smoking about 4 years ago. His smoking use included cigarettes. He has never used smokeless  tobacco. He reports that he drinks about 5.0 standard drinks of alcohol per week. He reports that he does not use drugs.   Family History:  The patient's family history includes Heart disease in his father and mother; Prostate cancer in his father.    ROS:  Please see the history of present illness.   Otherwise, review of systems are positive for none.   All other systems are reviewed and negative.    PHYSICAL EXAM: VS:  BP 126/84   Pulse 80   Ht 5\' 10"  (1.778 m)   Wt 203 lb (92.1 kg)   BMI 29.13 kg/m  , BMI Body mass index is 29.13 kg/m. Affect appropriate Healthy:  appears stated age 89: normal Neck supple with no adenopathy JVP normal no bruits no thyromegaly Lungs clear with no wheezing and good diaphragmatic motion Heart:  S1/S2 no murmur, no rub, gallop or click PMI normal Abdomen: benighn, BS positve, no tenderness, no AAA no bruit.  No HSM or HJR Distal pulses intact with no bruits No edema Neuro non-focal Skin warm and dry No muscular weakness    EKG:  SR rate 80 LBBB    Recent Labs: No results found for requested labs within last 8760 hours.  Lipid Panel No results found for: CHOL, TRIG, HDL, CHOLHDL, VLDL, LDLCALC, LDLDIRECT    Wt Readings from Last 3 Encounters:  01/01/18 203 lb (92.1 kg)  10/10/15 243 lb (110.2 kg)  09/26/15 199 lb 3.2 oz (90.4 kg)      Other studies Reviewed: Additional studies/ records that were reviewed today include: Notes from Dr Ernie Hew primary Carotid US, Calcium Score Labs and ECG .    ASSESSMENT AND PLAN:  1.  CAD:  Asymptomatic Calcium score of 205 which is about average for age ( 78 th percentile) in stetting  Of LBBB will order lexiscan myovue  ASA 81 mg continue statin  2. HTN:  Well controlled.  Continue current medications and low sodium Dash type diet.   3. HLD:  Continue crestor target LDL less than 70 4. Smoking:  Quit 2015 lung cancer screening CT 2018 ok repeat yearly  5. LBBB :  F/u echo to r/o  structural heart disease no murmur on exam BP ok and PMI normal    Current medicines are reviewed at length with the patient today.  The patient does not have concerns regarding medicines.  The following changes have been made:  no change  Labs/ tests ordered today include: Lexiscan Myovue , TTE   Orders Placed This Encounter  Procedures  . MYOCARDIAL PERFUSION IMAGING  . EKG 12-Lead  . ECHOCARDIOGRAM COMPLETE     Disposition:   FU with cardiology one year     Signed, Jenkins Rouge, MD  01/01/2018 10:47 AM    [facility], [place], [place]  [address] Phone: [phone]; Fax: [phone]

## 2018-01-01 ENCOUNTER — Ambulatory Visit: Payer: PPO | Admitting: Cardiovascular Disease

## 2018-01-01 VITALS — BP 126/84 | HR 80 | Ht 70.0 in | Wt 203.0 lb

## 2018-01-01 DIAGNOSIS — E785 Hyperlipidemia, unspecified: Secondary | ICD-10-CM

## 2018-01-01 DIAGNOSIS — I10 Essential (primary) hypertension: Secondary | ICD-10-CM | POA: Diagnosis not present

## 2018-01-01 DIAGNOSIS — I2510 Atherosclerotic heart disease of native coronary artery without angina pectoris: Secondary | ICD-10-CM | POA: Diagnosis not present

## 2018-01-01 DIAGNOSIS — R079 Chest pain, unspecified: Secondary | ICD-10-CM | POA: Diagnosis not present

## 2018-01-01 DIAGNOSIS — R0602 Shortness of breath: Secondary | ICD-10-CM

## 2018-01-01 NOTE — Patient Instructions (Addendum)
Medication Instructions:   If you need a refill on your cardiac medications before your next appointment, please call your pharmacy.   Lab work:  If you have labs (blood work) drawn today and your tests are completely normal, you will receive your results only by: Marland Kitchen MyChart Message (if you have MyChart) OR . A paper copy in the mail If you have any lab test that is abnormal or we need to change your treatment, we will call you to review the results.  Testing/Procedures: Your physician has requested that you have an echocardiogram. Echocardiography is a painless test that uses sound waves to create images of your heart. It provides your doctor with information about the size and shape of your heart and how well your heart's chambers and valves are working. This procedure takes approximately one hour. There are no restrictions for this procedure.  Your physician has requested that you have en exercise stress myoview. For further information please visit [email or web address]. Please follow instruction sheet, as given.  Follow-Up: At [facility], you and your health needs are our priority.  As part of our continuing mission to provide you with exceptional heart care, we have created designated Provider Care Teams.  These Care Teams include your primary Cardiologist (physician) and Advanced Practice Providers (APPs -  Physician Assistants and Nurse Practitioners) who all work together to provide you with the care you need, when you need it. Your physician wants you to follow-up in: 1 year with Dr. [name]. You will receive a reminder letter in the mail two months in advance. If you don't receive a letter, please call our office to schedule the follow-up appointment.

## 2018-01-01 NOTE — Addendum Note (Signed)
Addended by: Aris Georgia, Seferino Oscar L on: 01/01/2018 11:09 AM   Modules accepted: Orders

## 2018-01-09 ENCOUNTER — Telehealth (HOSPITAL_COMMUNITY): Payer: Self-pay | Admitting: *Deleted

## 2018-01-09 NOTE — Telephone Encounter (Signed)
Patient given detailed instructions per Myocardial Perfusion Study Information Sheet for the test on 01/15/18 at 10:15. Patient notified to arrive 15 minutes early and that it is imperative to arrive on time for appointment to keep from having the test rescheduled.  If you need to cancel or reschedule your appointment, please call the office within 24 hours of your appointment. . Patient verbalized understanding.Frank Foster

## 2018-01-15 ENCOUNTER — Ambulatory Visit (HOSPITAL_BASED_OUTPATIENT_CLINIC_OR_DEPARTMENT_OTHER): Payer: PPO

## 2018-01-15 ENCOUNTER — Ambulatory Visit (HOSPITAL_COMMUNITY): Payer: PPO | Attending: Cardiovascular Disease

## 2018-01-15 ENCOUNTER — Other Ambulatory Visit: Payer: Self-pay

## 2018-01-15 ENCOUNTER — Telehealth: Payer: Self-pay

## 2018-01-15 DIAGNOSIS — R079 Chest pain, unspecified: Secondary | ICD-10-CM

## 2018-01-15 DIAGNOSIS — E785 Hyperlipidemia, unspecified: Secondary | ICD-10-CM

## 2018-01-15 DIAGNOSIS — I2510 Atherosclerotic heart disease of native coronary artery without angina pectoris: Secondary | ICD-10-CM

## 2018-01-15 DIAGNOSIS — R0602 Shortness of breath: Secondary | ICD-10-CM | POA: Diagnosis not present

## 2018-01-15 DIAGNOSIS — I10 Essential (primary) hypertension: Secondary | ICD-10-CM

## 2018-01-15 LAB — MYOCARDIAL PERFUSION IMAGING
CHL CUP NUCLEAR SRS: 1
LV dias vol: 116 mL (ref 62–150)
LVSYSVOL: 73 mL
Peak HR: 97 {beats}/min
Rest HR: 71 {beats}/min
SDS: 1
SSS: 2
TID: 1.04

## 2018-01-15 LAB — ECHOCARDIOGRAM COMPLETE
Height: 70 in
Weight: 3248 oz

## 2018-01-15 MED ORDER — SACUBITRIL-VALSARTAN 24-26 MG PO TABS: 1.0000 | ORAL_TABLET | Freq: Two times a day (BID) | ORAL | 11 refills | Status: DC

## 2018-01-15 MED ORDER — TECHNETIUM TC 99M TETROFOSMIN IV KIT
26.6000 | PACK | Freq: Once | INTRAVENOUS | Status: AC | PRN
  Administered 2018-01-15: 26.6 via INTRAVENOUS
  Filled 2018-01-15: qty 27

## 2018-01-15 MED ORDER — REGADENOSON 0.4 MG/5ML IV SOLN
0.4000 mg | Freq: Once | INTRAVENOUS | Status: AC
  Administered 2018-01-15: 0.4 mg via INTRAVENOUS

## 2018-01-15 MED ORDER — CARVEDILOL 3.125 MG PO TABS: 3.1250 mg | ORAL_TABLET | Freq: Two times a day (BID) | ORAL | 3 refills | Status: DC

## 2018-01-15 MED ORDER — TECHNETIUM TC 99M TETROFOSMIN IV KIT
9.0000 | PACK | Freq: Once | INTRAVENOUS | Status: AC | PRN
  Administered 2018-01-15: 9 via INTRAVENOUS
  Filled 2018-01-15: qty 9

## 2018-01-15 NOTE — Telephone Encounter (Signed)
Called patient. Made patient an appointment tomorrow with Dr. Johnsie Cancel. Will schedule cath at appt. Medications updated and sent in to patient's pharmacy. Entresto 24/26 mg BID and Coreg 3.125 mg BID. Discontinued Cozaar. Patient verbalized understanding.

## 2018-01-15 NOTE — Telephone Encounter (Signed)
-----   Message from Josue Hector, MD sent at 01/15/2018  5:25 PM EST ----- Had long talk with him on phone needs right and left cath for cardiomyoapthy. Get pre cath labs and BNP tomorrow of Friday in our office D/c Cozaar and start low dose entresto and coreg 3.125 bid. Get him on my schedule next week or two as wife needs to be talked to about cath can see him tomorrow as DOD day

## 2018-01-16 ENCOUNTER — Other Ambulatory Visit: Payer: Self-pay | Admitting: Cardiovascular Disease

## 2018-01-16 ENCOUNTER — Encounter: Payer: Self-pay | Admitting: Cardiovascular Disease

## 2018-01-16 ENCOUNTER — Ambulatory Visit: Payer: PPO | Admitting: Cardiovascular Disease

## 2018-01-16 VITALS — BP 148/82 | HR 75 | Ht 70.0 in | Wt 203.4 lb

## 2018-01-16 DIAGNOSIS — I447 Left bundle-branch block, unspecified: Secondary | ICD-10-CM | POA: Diagnosis not present

## 2018-01-16 DIAGNOSIS — I429 Cardiomyopathy, unspecified: Secondary | ICD-10-CM | POA: Diagnosis not present

## 2018-01-16 DIAGNOSIS — I2510 Atherosclerotic heart disease of native coronary artery without angina pectoris: Secondary | ICD-10-CM

## 2018-01-16 DIAGNOSIS — I5020 Unspecified systolic (congestive) heart failure: Secondary | ICD-10-CM

## 2018-01-16 DIAGNOSIS — E785 Hyperlipidemia, unspecified: Secondary | ICD-10-CM

## 2018-01-16 DIAGNOSIS — I10 Essential (primary) hypertension: Secondary | ICD-10-CM | POA: Diagnosis not present

## 2018-01-16 LAB — CBC WITH DIFFERENTIAL/PLATELET
Basophils Absolute: 0 10*3/uL (ref 0.0–0.2)
Basos: 1 %
EOS (ABSOLUTE): 0.1 10*3/uL (ref 0.0–0.4)
Eos: 1 %
HEMOGLOBIN: 15.6 g/dL (ref 13.0–17.7)
Hematocrit: 44.7 % (ref 37.5–51.0)
Immature Grans (Abs): 0 10*3/uL (ref 0.0–0.1)
Immature Granulocytes: 1 %
LYMPHS ABS: 1.5 10*3/uL (ref 0.7–3.1)
Lymphs: 22 %
MCH: 32.2 pg (ref 26.6–33.0)
MCHC: 34.9 g/dL (ref 31.5–35.7)
MCV: 92 fL (ref 79–97)
MONOCYTES: 9 %
Monocytes Absolute: 0.6 10*3/uL (ref 0.1–0.9)
Neutrophils Absolute: 4.4 10*3/uL (ref 1.4–7.0)
Neutrophils: 66 %
Platelets: 241 10*3/uL (ref 150–450)
RBC: 4.85 x10E6/uL (ref 4.14–5.80)
RDW: 12 % — ABNORMAL LOW (ref 12.3–15.4)
WBC: 6.6 10*3/uL (ref 3.4–10.8)

## 2018-01-16 LAB — BASIC METABOLIC PANEL
BUN/Creatinine Ratio: 14 (ref 10–24)
BUN: 13 mg/dL (ref 8–27)
CO2: 25 mmol/L (ref 20–29)
Calcium: 9.9 mg/dL (ref 8.6–10.2)
Chloride: 98 mmol/L (ref 96–106)
Creatinine, Ser: 0.94 mg/dL (ref 0.76–1.27)
GFR calc Af Amer: 94 mL/min/{1.73_m2} (ref >59)
GFR calc non Af Amer: 81 mL/min/{1.73_m2} (ref >59)
Glucose: 93 mg/dL (ref 65–99)
Potassium: 4.7 mmol/L (ref 3.5–5.2)
Sodium: 140 mmol/L (ref 134–144)

## 2018-01-16 NOTE — H&P (View-Only) (Signed)
Cardiology Office Note   Date:  01/16/2018   ID:  Melroy Bougher, DOB [birth year], MRN [id number]  PCP:  Fanny Bien, MD  Cardiologist:   Jenkins Rouge, MD   No chief complaint on file.     History of Present Illness: Frank Foster is a 72 y.o. male who presents for f/u regarding CAD. He is a former smoker quitting in 2015. History of HTN and HLD. Had screening carotid and calcium score done 10/17/17 Carotids with mild plaque no stenosis Calcium score with mild 3 vessel calcium with score of 205 This was incorrectly labeled as 18 th percentile for age and sex as it is only 66 th percentile for age and sex. He has no cardiac symptoms   He has no chest pain. He is somewhat sedentary Walks 3x/week and golfs a bit. Long discussion with Him about his ECG today showing LBBB. No old ECG to compare. 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 Body mass index (bmi) 28.0-28.9, adult   . Gout   . Hyperlipidemia   . Hypertension     Past Surgical History:  Procedure Laterality Date  . broken nose    . KNEE SURGERY     arthroscopic right knee     Current Outpatient Medications  Medication Sig Dispense Refill  . allopurinol (ZYLOPRIM) 100 MG tablet Take 100 mg by mouth daily.    . carvedilol (COREG) 3.125 MG tablet Take 1 tablet (3.125 mg total) by mouth 2 (two) times daily. 180 tablet 3  . colchicine 0.6 MG tablet Take 0.6 mg by mouth as needed (FOR GOUT FLARE UPS).     . rosuvastatin (CRESTOR)  5 MG tablet Take 5 mg by mouth daily.    . sacubitril-valsartan (ENTRESTO) 24-26 MG Take 1 tablet by mouth 2 (two) times daily. 60 tablet 11   Current Facility-Administered Medications  Medication Dose Route Frequency Provider Last Rate Last Dose  . 0.9 %  sodium chloride infusion  500 mL Intravenous Continuous Ladene Artist, MD        Allergies:   Penicillins    Social History:  The patient  reports that he quit smoking about 4 years ago. His smoking use included cigarettes. He has never used smokeless tobacco. He reports current alcohol use of about 5.0 standard drinks of alcohol per week. He reports that he does not use drugs.   Family History:  The patient's family history includes Heart disease in his father and mother; Prostate cancer in his father.    ROS:  Please see the history of present illness.   Otherwise, review of systems are positive for none.   All other systems are reviewed and negative.    PHYSICAL EXAM: VS:  BP (!) 148/82   Pulse 75   Ht 5\' 10"  (1.778 m)   Wt 203 lb 6.4 oz (92.3 kg)   SpO2 98%   BMI 29.18 kg/m  , BMI Body mass index is 29.18 kg/m. Affect appropriate Healthy:  appears  stated age 65: normal Neck supple with no adenopathy JVP normal no bruits no thyromegaly Lungs clear with no wheezing and good diaphragmatic motion Heart:  S1/S2 no murmur, no rub, gallop or click PMI normal Abdomen: benighn, BS positve, no tenderness, no AAA no bruit.  No HSM or HJR Distal pulses intact with no bruits No edema Neuro non-focal Skin warm and dry No muscular weakness    EKG:  SR rate 80 LBBB    Recent Labs: No results found for requested labs within last 8760 hours.    Lipid Panel No results found for: CHOL, TRIG, HDL, CHOLHDL, VLDL, LDLCALC, LDLDIRECT    Wt Readings from Last 3 Encounters:  01/16/18 203 lb 6.4 oz (92.3 kg)  01/15/18 203 lb (92.1 kg)  01/01/18 203 lb (92.1 kg)      Other studies Reviewed: Additional studies/ records  that were reviewed today include: Notes from Dr Ernie Hew primary Carotid US, Calcium Score Labs and ECG .    ASSESSMENT AND PLAN:  1.  CAD:  Asymptomatic Calcium score of 205 which is about average for age ( 66 th percentile) in stetting  Of LBBB abnormal myovue suggesting possible old silent anterior MI Given low EF arranging right and left cath 2. HTN:  Well controlled.  Continue current medications and low sodium Dash type diet.   3. HLD:  Continue crestor target LDL less than 70 4. Smoking:  Quit 2015 lung cancer screening CT 2018 ok repeat yearly  5. LBBB :  No high grade heart block reflective of DCM  6. DCM:  EF 40% by TTE start coreg and Entresto d/c ARB   Current medicines are reviewed at length with the patient today.  The patient does not have concerns regarding medicines.  The following changes have been made:  no change  Labs/ tests ordered today include: pre cath labs  Orders done and cath lab called CM to do first case Thursday  Direct time spent with patient 60 minutes   Orders Placed This Encounter  Procedures  . Basic metabolic panel  . CBC with Differential/Platelet     Disposition:   FU with cardiology one year     Signed, Jenkins Rouge, MD  01/16/2018 11:36 AM    [facility] [address], [place], [place]  [address] Phone: [phone]; Fax: [phone]

## 2018-01-16 NOTE — Patient Instructions (Addendum)
Medication Instructions:   1-STOP Losartan 2-START Entresto 24/26 mg by mouth twice daily 3-START Carvedilol 3.125 mg by mouth twice daily  If you need a refill on your cardiac medications before your next appointment, please call your pharmacy.   Lab work: Your physician recommends that you have lab work today- BMET and CBC  If you have labs (blood work) drawn today and your tests are completely normal, you will receive your results only by: Marland Kitchen MyChart Message (if you have MyChart) OR . A paper copy in the mail If you have any lab test that is abnormal or we need to change your treatment, we will call you to review the results.  Testing/Procedures: Your physician has requested that you have a cardiac catheterization. Cardiac catheterization is used to diagnose and/or treat various heart conditions. Doctors may recommend this procedure for a number of different reasons. The most common reason is to evaluate chest pain. Chest pain can be a symptom of coronary artery disease (CAD), and cardiac catheterization can show whether plaque is narrowing or blocking your heart's arteries. This procedure is also used to evaluate the valves, as well as measure the blood flow and oxygen levels in different parts of your heart. For further information please visit [facility]. Please follow instruction sheet, as given.  Follow-Up: At [facility], you and your health needs are our priority.  As part of our continuing mission to provide you with exceptional heart care, we have created designated Provider Care Teams.  These Care Teams include your primary Cardiologist (physician) and Advanced Practice Providers (APPs -  Physician Assistants and Nurse Practitioners) who all work together to provide you with the care you need, when you need it. You will need a follow up appointment in 3 weeks. You may see Jenkins Rouge, MD or one of the following Advanced Practice Providers on your designated Care Team:    Truitt Merle, NP Cecilie Kicks, NP . Kathyrn Drown, NP     [facility] [place], [place] [address] Dept: [phone] Loc: [place]  01/16/2018  You are scheduled for a Cardiac Catheterization on Thursday, December 26 with Dr. Lauree Chandler.  1. Please arrive at the [facility] (Main Entrance A) at [facility]: [address] [place], [place] [address] at 5:30 AM (This time is two hours before your procedure to ensure your preparation). Free valet parking service is available.   Special note: Every effort is made to have your procedure done on time. Please understand that emergencies sometimes delay scheduled procedures.  2. Diet: Do not eat solid foods after midnight.  The patient may have clear liquids until 5am upon the day of the procedure.  3. Labs: You will need to have blood drawn on Thursday, December 19 at [facility] at [facility]. [address]  Open: 7:30am - 5pm    Phone: [phone]. You do not need to be fasting.  4. Medication instructions in preparation for your procedure:   Contrast Allergy: No  HOLD Entresto Wednesday, December 25   On the morning of your procedure, take your Aspirin and any morning medicines NOT listed above.  You may use sips of water.  5. Plan for one night stay--bring personal belongings. 6. Bring a current list of your medications and current insurance cards. 7. You MUST have a responsible person to drive you home. 8. Someone MUST  be with you the first 24 hours after you arrive home or your discharge will be delayed. 9. Please wear clothes that are easy to get on and off and wear slip-on shoes.  Thank you for allowing [place] to care for you!   -- [facility]

## 2018-01-16 NOTE — Progress Notes (Signed)
Cardiology Office Note   Date:  01/16/2018   ID:  Idriss Quackenbush, DOB 1945/07/31, MRN [id number]  PCP:  Fanny Bien, MD  Cardiologist:   Jenkins Rouge, MD   No chief complaint on file.     History of Present Illness: Frank Foster is a 72 y.o. male who presents for f/u regarding CAD. He is a former smoker quitting in 2015. History of HTN and HLD. Had screening carotid and calcium score done 10/17/17 Carotids with mild plaque no stenosis Calcium score with mild 3 vessel calcium with score of 205 This was incorrectly labeled as 14 th percentile for age and sex as it is only 5 th percentile for age and sex. He has no cardiac symptoms   He has no chest pain. He is somewhat sedentary Walks 3x/week and golfs a bit. Long discussion with Him about his ECG today showing LBBB. No old ECG to compare. 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 Body mass index (bmi) 28.0-28.9, adult   . Gout   . Hyperlipidemia   . Hypertension     Past Surgical History:  Procedure Laterality Date  . broken nose    . KNEE SURGERY     arthroscopic right knee     Current Outpatient Medications  Medication Sig Dispense Refill  . allopurinol (ZYLOPRIM) 100 MG tablet Take 100 mg by mouth daily.    . carvedilol (COREG) 3.125 MG tablet Take 1 tablet (3.125 mg total) by mouth 2 (two) times daily. 180 tablet 3  . colchicine 0.6 MG tablet Take 0.6 mg by mouth as needed (FOR GOUT FLARE UPS).     . rosuvastatin (CRESTOR)  5 MG tablet Take 5 mg by mouth daily.    . sacubitril-valsartan (ENTRESTO) 24-26 MG Take 1 tablet by mouth 2 (two) times daily. 60 tablet 11   Current Facility-Administered Medications  Medication Dose Route Frequency Provider Last Rate Last Dose  . 0.9 %  sodium chloride infusion  500 mL Intravenous Continuous Ladene Artist, MD        Allergies:   Penicillins    Social History:  The patient  reports that he quit smoking about 4 years ago. His smoking use included cigarettes. He has never used smokeless tobacco. He reports current alcohol use of about 5.0 standard drinks of alcohol per week. He reports that he does not use drugs.   Family History:  The patient's family history includes Heart disease in his father and mother; Prostate cancer in his father.    ROS:  Please see the history of present illness.   Otherwise, review of systems are positive for none.   All other systems are reviewed and negative.    PHYSICAL EXAM: VS:  BP (!) 148/82   Pulse 75   Ht 5\' 10"  (1.778 m)   Wt 203 lb 6.4 oz (92.3 kg)   SpO2 98%   BMI 29.18 kg/m  , BMI Body mass index is 29.18 kg/m. Affect appropriate Healthy:  appears  stated age 24: normal Neck supple with no adenopathy JVP normal no bruits no thyromegaly Lungs clear with no wheezing and good diaphragmatic motion Heart:  S1/S2 no murmur, no rub, gallop or click PMI normal Abdomen: benighn, BS positve, no tenderness, no AAA no bruit.  No HSM or HJR Distal pulses intact with no bruits No edema Neuro non-focal Skin warm and dry No muscular weakness    EKG:  SR rate 80 LBBB    Recent Labs: No results found for requested labs within last 8760 hours.    Lipid Panel No results found for: CHOL, TRIG, HDL, CHOLHDL, VLDL, LDLCALC, LDLDIRECT    Wt Readings from Last 3 Encounters:  01/16/18 203 lb 6.4 oz (92.3 kg)  01/15/18 203 lb (92.1 kg)  01/01/18 203 lb (92.1 kg)      Other studies Reviewed: Additional studies/ records  that were reviewed today include: Notes from Dr Ernie Hew primary Carotid US, Calcium Score Labs and ECG .    ASSESSMENT AND PLAN:  1.  CAD:  Asymptomatic Calcium score of 205 which is about average for age ( 17 th percentile) in stetting  Of LBBB abnormal myovue suggesting possible old silent anterior MI Given low EF arranging right and left cath 2. HTN:  Well controlled.  Continue current medications and low sodium Dash type diet.   3. HLD:  Continue crestor target LDL less than 70 4. Smoking:  Quit 2015 lung cancer screening CT 2018 ok repeat yearly  5. LBBB :  No high grade heart block reflective of DCM  6. DCM:  EF 40% by TTE start coreg and Entresto d/c ARB   Current medicines are reviewed at length with the patient today.  The patient does not have concerns regarding medicines.  The following changes have been made:  no change  Labs/ tests ordered today include: pre cath labs  Orders done and cath lab called CM to do first case Thursday  Direct time spent with patient 60 minutes   Orders Placed This Encounter  Procedures  . Basic metabolic panel  . CBC with Differential/Platelet     Disposition:   FU with cardiology one year     Signed, Jenkins Rouge, MD  01/16/2018 11:36 AM    [address] [place], [place], [place]  [address] Phone: [phone]; Fax: [phone]

## 2018-01-23 ENCOUNTER — Encounter (HOSPITAL_COMMUNITY): Payer: Self-pay | Admitting: Cardiovascular Disease

## 2018-01-23 ENCOUNTER — Other Ambulatory Visit: Payer: Self-pay

## 2018-01-23 ENCOUNTER — Encounter (HOSPITAL_COMMUNITY)
Admission: RE | Disposition: A | Discharge status: Home / Self Care | Payer: Self-pay | Source: Home / Self Care | Attending: Cardiovascular Disease

## 2018-01-23 ENCOUNTER — Ambulatory Visit (HOSPITAL_COMMUNITY)
Admission: RE | Admit: 2018-01-23 | Discharge: 2018-01-23 | Disposition: A | Discharge status: Home / Self Care | Payer: PPO | Attending: Cardiovascular Disease | Admitting: Cardiovascular Disease

## 2018-01-23 DIAGNOSIS — E785 Hyperlipidemia, unspecified: Secondary | ICD-10-CM | POA: Insufficient documentation

## 2018-01-23 DIAGNOSIS — Z8249 Family history of ischemic heart disease and other diseases of the circulatory system: Secondary | ICD-10-CM | POA: Insufficient documentation

## 2018-01-23 DIAGNOSIS — Z87891 Personal history of nicotine dependence: Secondary | ICD-10-CM | POA: Insufficient documentation

## 2018-01-23 DIAGNOSIS — I2510 Atherosclerotic heart disease of native coronary artery without angina pectoris: Secondary | ICD-10-CM | POA: Diagnosis not present

## 2018-01-23 DIAGNOSIS — Z880 Allergy status to penicillin: Secondary | ICD-10-CM | POA: Diagnosis not present

## 2018-01-23 DIAGNOSIS — Z79899 Other long term (current) drug therapy: Secondary | ICD-10-CM | POA: Diagnosis not present

## 2018-01-23 DIAGNOSIS — I447 Left bundle-branch block, unspecified: Secondary | ICD-10-CM | POA: Insufficient documentation

## 2018-01-23 DIAGNOSIS — I2584 Coronary atherosclerosis due to calcified coronary lesion: Secondary | ICD-10-CM | POA: Insufficient documentation

## 2018-01-23 DIAGNOSIS — I252 Old myocardial infarction: Secondary | ICD-10-CM | POA: Diagnosis not present

## 2018-01-23 DIAGNOSIS — M109 Gout, unspecified: Secondary | ICD-10-CM | POA: Insufficient documentation

## 2018-01-23 DIAGNOSIS — I10 Essential (primary) hypertension: Secondary | ICD-10-CM | POA: Insufficient documentation

## 2018-01-23 DIAGNOSIS — I420 Dilated cardiomyopathy: Secondary | ICD-10-CM

## 2018-01-23 DIAGNOSIS — R0600 Dyspnea, unspecified: Secondary | ICD-10-CM | POA: Diagnosis not present

## 2018-01-23 HISTORY — PX: RIGHT/LEFT HEART CATH AND CORONARY ANGIOGRAPHY: CATH118266

## 2018-01-23 LAB — POCT I-STAT 3, ART BLOOD GAS (G3+)
Acid-base deficit: 1 mmol/L (ref 0.0–2.0)
Bicarbonate: 24.4 mmol/L (ref 20.0–28.0)
O2 Saturation: 96 %
PH ART: 7.351 (ref 7.350–7.450)
TCO2: 26 mmol/L (ref 22–32)
pCO2 arterial: 44 mmHg (ref 32.0–48.0)
pO2, Arterial: 83 mmHg (ref 83.0–108.0)

## 2018-01-23 LAB — POCT I-STAT 3, VENOUS BLOOD GAS (G3P V)
Acid-base deficit: 2 mmol/L (ref 0.0–2.0)
Bicarbonate: 23.3 mmol/L (ref 20.0–28.0)
O2 Saturation: 67 %
TCO2: 25 mmol/L (ref 22–32)
pCO2, Ven: 41.1 mmHg — ABNORMAL LOW (ref 44.0–60.0)
pH, Ven: 7.362 (ref 7.250–7.430)
pO2, Ven: 36 mmHg (ref 32.0–45.0)

## 2018-01-23 SURGERY — RIGHT/LEFT HEART CATH AND CORONARY ANGIOGRAPHY
Anesthesia: LOCAL

## 2018-01-23 MED ORDER — HEPARIN (PORCINE) IN NACL 1000-0.9 UT/500ML-% IV SOLN
INTRAVENOUS | Status: AC
  Filled 2018-01-23: qty 1000

## 2018-01-23 MED ORDER — LIDOCAINE HCL (PF) 1 % IJ SOLN
INTRAMUSCULAR | Status: DC | PRN
  Administered 2018-01-23 (×2): 2 mL

## 2018-01-23 MED ORDER — HEPARIN SODIUM (PORCINE) 1000 UNIT/ML IJ SOLN
INTRAMUSCULAR | Status: DC | PRN
  Administered 2018-01-23: 4500 [IU] via INTRAVENOUS

## 2018-01-23 MED ORDER — HEPARIN SODIUM (PORCINE) 1000 UNIT/ML IJ SOLN
INTRAMUSCULAR | Status: AC
  Filled 2018-01-23: qty 1

## 2018-01-23 MED ORDER — IOHEXOL 350 MG/ML SOLN
INTRAVENOUS | Status: DC | PRN
  Administered 2018-01-23: 55 mL via INTRAVENOUS

## 2018-01-23 MED ORDER — SODIUM CHLORIDE 0.9% FLUSH: 3.0000 mL | Freq: Two times a day (BID) | INTRAVENOUS | Status: DC

## 2018-01-23 MED ORDER — ASPIRIN 81 MG PO CHEW
81.0000 mg | CHEWABLE_TABLET | Freq: Once | ORAL | Status: AC
  Administered 2018-01-23: 81 mg via ORAL
  Filled 2018-01-23: qty 1

## 2018-01-23 MED ORDER — MIDAZOLAM HCL 2 MG/2ML IJ SOLN
INTRAMUSCULAR | Status: AC
  Filled 2018-01-23: qty 2

## 2018-01-23 MED ORDER — SODIUM CHLORIDE 0.9 % IV SOLN: 250.0000 mL | INTRAVENOUS | Status: DC | PRN

## 2018-01-23 MED ORDER — VERAPAMIL HCL 2.5 MG/ML IV SOLN
INTRAVENOUS | Status: AC
  Filled 2018-01-23: qty 2

## 2018-01-23 MED ORDER — LIDOCAINE HCL (PF) 1 % IJ SOLN
INTRAMUSCULAR | Status: AC
  Filled 2018-01-23: qty 30

## 2018-01-23 MED ORDER — SODIUM CHLORIDE 0.9% FLUSH: 3.0000 mL | INTRAVENOUS | Status: DC | PRN

## 2018-01-23 MED ORDER — SODIUM CHLORIDE 0.9 % WEIGHT BASED INFUSION: 1.0000 mL/kg/h | INTRAVENOUS | Status: DC

## 2018-01-23 MED ORDER — HEPARIN (PORCINE) IN NACL 1000-0.9 UT/500ML-% IV SOLN
INTRAVENOUS | Status: DC | PRN
  Administered 2018-01-23 (×2): 500 mL

## 2018-01-23 MED ORDER — FENTANYL CITRATE (PF) 100 MCG/2ML IJ SOLN
INTRAMUSCULAR | Status: DC | PRN
  Administered 2018-01-23: 25 ug via INTRAVENOUS

## 2018-01-23 MED ORDER — ACETAMINOPHEN 325 MG PO TABS: 650.0000 mg | ORAL_TABLET | ORAL | Status: DC | PRN

## 2018-01-23 MED ORDER — SODIUM CHLORIDE 0.9 % WEIGHT BASED INFUSION
3.0000 mL/kg/h | INTRAVENOUS | Status: AC
  Administered 2018-01-23: 3 mL/kg/h via INTRAVENOUS

## 2018-01-23 MED ORDER — MIDAZOLAM HCL 2 MG/2ML IJ SOLN
INTRAMUSCULAR | Status: DC | PRN
  Administered 2018-01-23: 1 mg via INTRAVENOUS

## 2018-01-23 MED ORDER — FENTANYL CITRATE (PF) 100 MCG/2ML IJ SOLN
INTRAMUSCULAR | Status: AC
  Filled 2018-01-23: qty 2

## 2018-01-23 MED ORDER — VERAPAMIL HCL 2.5 MG/ML IV SOLN
INTRAVENOUS | Status: DC | PRN
  Administered 2018-01-23: 08:00:00 via INTRA_ARTERIAL

## 2018-01-23 MED ORDER — SODIUM CHLORIDE 0.9 % IV SOLN: INTRAVENOUS | Status: AC

## 2018-01-23 MED ORDER — ONDANSETRON HCL 4 MG/2ML IJ SOLN: 4.0000 mg | Freq: Four times a day (QID) | INTRAMUSCULAR | Status: DC | PRN

## 2018-01-23 SURGICAL SUPPLY — 11 items

## 2018-01-23 NOTE — Discharge Instructions (Signed)
Radial Site Care ° °This sheet gives you information about how to care for yourself after your procedure. Your health care provider may also give you more specific instructions. If you have problems or questions, contact your health care provider. °What can I expect after the procedure? °After the procedure, it is common to have: °· Bruising and tenderness at the catheter insertion area. °Follow these instructions at home: °Medicines °· Take over-the-counter and prescription medicines only as told by your health care provider. °Insertion site care °· Follow instructions from your health care provider about how to take care of your insertion site. Make sure you: °? Wash your hands with soap and water before you change your bandage (dressing). If soap and water are not available, use hand sanitizer. °? Change your dressing as told by your health care provider. °? Leave stitches (sutures), skin glue, or adhesive strips in place. These skin closures may need to stay in place for 2 weeks or longer. If adhesive strip edges start to loosen and curl up, you may trim the loose edges. Do not remove adhesive strips completely unless your health care provider tells you to do that. °· Check your insertion site every day for signs of infection. Check for: °? Redness, swelling, or pain. °? Fluid or blood. °? Pus or a bad smell. °? Warmth. °· Do not take baths, swim, or use a hot tub until your health care provider approves. °· You may shower 24-48 hours after the procedure, or as directed by your health care provider. °? Remove the dressing and gently wash the site with plain soap and water. °? Pat the area dry with a clean towel. °? Do not rub the site. That could cause bleeding. °· Do not apply powder or lotion to the site. °Activity ° °· For 24 hours after the procedure, or as directed by your health care provider: °? Do not flex or bend the affected arm. °? Do not push or pull heavy objects with the affected arm. °? Do not  drive yourself home from the hospital or clinic. You may drive 24 hours after the procedure unless your health care provider tells you not to. °? Do not operate machinery or power tools. °· Do not lift anything that is heavier than 10 lb (4.5 kg), or the limit that you are told, until your health care provider says that it is safe. °· Ask your health care provider when it is okay to: °? Return to work or school. °? Resume usual physical activities or sports. °? Resume sexual activity. °General instructions °· If the catheter site starts to bleed, raise your arm and put firm pressure on the site. If the bleeding does not stop, get help right away. This is a medical emergency. °· If you went home on the same day as your procedure, a responsible adult should be with you for the first 24 hours after you arrive home. °· Keep all follow-up visits as told by your health care provider. This is important. °Contact a health care provider if: °· You have a fever. °· You have redness, swelling, or yellow drainage around your insertion site. °Get help right away if: °· You have unusual pain at the radial site. °· The catheter insertion area swells very fast. °· The insertion area is bleeding, and the bleeding does not stop when you hold steady pressure on the area. °· Your arm or hand becomes pale, cool, tingly, or numb. °These symptoms may represent a serious problem   that is an emergency. Do not wait to see if the symptoms will go away. Get medical help right away. Call your local emergency services (911 in the [place] Do not drive yourself to the hospital. °Summary °· After the procedure, it is common to have bruising and tenderness at the site. °· Follow instructions from your health care provider about how to take care of your radial site wound. Check the wound every day for signs of infection. °· Do not lift anything that is heavier than 10 lb (4.5 kg), or the limit that you are told, until your health care provider says  that it is safe. °This information is not intended to replace advice given to you by your health care provider. Make sure you discuss any questions you have with your health care provider. °Document Released: 02/17/2010 Document Revised: 02/20/2017 Document Reviewed: 02/20/2017 °Elsevier Interactive Patient Education © 2019 [employer] ° °

## 2018-01-23 NOTE — Interval H&P Note (Signed)
History and Physical Interval Note:  01/23/2018 7:30 AM  Ree Edman  has presented today for surgery, with the diagnosis of cardiomyopathy  The various methods of treatment have been discussed with the patient and family. After consideration of risks, benefits and other options for treatment, the patient has consented to  Procedure(s): RIGHT/LEFT HEART CATH AND CORONARY ANGIOGRAPHY (N/A) as a surgical intervention .  The patient's history has been reviewed, patient examined, no change in status, stable for surgery.  I have reviewed the patient's chart and labs.  Questions were answered to the patient's satisfaction.    Cath Lab Visit (complete for each Cath Lab visit)  Clinical Evaluation Leading to the Procedure:   ACS: No.  Non-ACS:    Anginal Classification: No Symptoms  Anti-ischemic medical therapy: Minimal Therapy (1 class of medications)  Non-Invasive Test Results: Intermediate-risk stress test findings: cardiac mortality 1-3%/year  Prior CABG: No previous CABG         Lauree Chandler

## 2018-01-24 ENCOUNTER — Telehealth: Payer: Self-pay

## 2018-01-24 NOTE — Telephone Encounter (Signed)
Spoke with rep at [employer] 24-26 is approved. They will fax approval letter with additional documentation.

## 2018-01-27 NOTE — Telephone Encounter (Signed)
Received notification that Delene Loll 24-26 is approved from 01/24/18-01/29/2019.

## 2018-01-27 NOTE — Progress Notes (Signed)
Cardiology Office Note   Date:  02/03/2018   ID:  Frank Foster, DOB Jan 21, 1946, MRN [id number]  PCP:  Frank Bien, MD  Cardiologist:   Frank Rouge, MD   No chief complaint on file.     History of Present Illness: Frank Foster is a 72 y.o. male who presents for f/u regarding CAD. He is a former smoker quitting in 2015. History of HTN and HLD. Had screening carotid and calcium score done 10/17/17 Carotids with mild plaque no stenosis Calcium score with mild 3 vessel calcium with score of 205 This was incorrectly labeled as 11 th percentile for age and sex as it is only 26 th percentile for age and sex. He has no cardiac symptoms   He has no chest pain. He is somewhat sedentary Walks 3x/week and golfs a bit. Long discussion with Him about his ECG today showing LBBB. No old ECG to compare. 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 Body mass index (bmi) 28.0-28.9, adult   . Gout   . Hyperlipidemia   . Hypertension     Past Surgical History:  Procedure Laterality Date  . broken nose    . KNEE SURGERY     arthroscopic right knee  . RIGHT/LEFT HEART CATH AND CORONARY ANGIOGRAPHY N/A 01/23/2018   Procedure: RIGHT/LEFT HEART CATH AND CORONARY ANGIOGRAPHY;  Surgeon: Burnell Blanks, MD;  Location: [facility];  Service: Cardiovascular;  Laterality: N/A;     Current Outpatient Medications  Medication Sig Dispense Refill  . allopurinol (ZYLOPRIM) 100 MG tablet Take 100 mg by mouth every evening.     Marland Kitchen aspirin EC 81 MG tablet Take 81 mg by mouth  every evening.    . carvedilol (COREG) 3.125 MG tablet Take 1 tablet (3.125 mg total) by mouth 2 (two) times daily. 180 tablet 3  . colchicine 0.6 MG tablet Take 0.6 mg by mouth as needed (FOR GOUT FLARE UPS).     . rosuvastatin (CRESTOR) 5 MG tablet Take 5 mg by mouth every evening.     . sacubitril-valsartan (ENTRESTO) 24-26 MG Take 1 tablet by mouth 2 (two) times daily. 60 tablet 11   Current Facility-Administered Medications  Medication Dose Route Frequency Provider Last Rate Last Dose  . 0.9 %  sodium chloride infusion  500 mL Intravenous Continuous Ladene Artist, MD        Allergies:   Penicillins    Social History:  The patient  reports that he quit smoking about 4 years ago. His smoking use included cigarettes. He has never used smokeless tobacco. He reports current alcohol use of about 5.0 standard drinks of alcohol per week. He reports that he does not use drugs.   Family History:  The patient's family history includes Heart disease in his father and mother; Prostate cancer in his father.    ROS:  Please see the history of present illness.   Otherwise, review of systems are positive for none.   All other systems are reviewed and  negative.    PHYSICAL EXAM: VS:  BP 112/68   Pulse 66   Ht 5\' 10"  (1.778 m)   Wt 198 lb 3.2 oz (89.9 kg)   SpO2 98%   BMI 28.44 kg/m  , BMI Body mass index is 28.44 kg/m. Affect appropriate Healthy:  appears stated age 88: normal Neck supple with no adenopathy JVP normal no bruits no thyromegaly Lungs clear with no wheezing and good diaphragmatic motion Heart:  S1/S2 no murmur, no rub, gallop or click PMI normal Abdomen: benighn, BS positve, no tenderness, no AAA no bruit.  No HSM or HJR Distal pulses intact with no bruits No edema Neuro non-focal Skin warm and dry No muscular weakness Right radial cath sight well healed     EKG:  SR rate 80 LBBB    Recent Labs: 01/16/2018: BUN 13; Creatinine, Ser 0.94; Hemoglobin 15.6;  Platelets 241; Potassium 4.7; Sodium 140    Lipid Panel No results found for: CHOL, TRIG, HDL, CHOLHDL, VLDL, LDLCALC, LDLDIRECT    Wt Readings from Last 3 Encounters:  02/03/18 198 lb 3.2 oz (89.9 kg)  01/23/18 193 lb (87.5 kg)  01/16/18 203 lb 6.4 oz (92.3 kg)      Other studies Reviewed: Additional studies/ records that were reviewed today include: Notes from Dr Ernie Hew primary Carotid US, Calcium Score Labs and ECG .    ASSESSMENT AND PLAN:  1.  CAD:  Asymptomatic Calcium score of 205 which is about average for age ( 70 th percentile) in stetting  Of LBBB Cath 01/23/18 non obstructive disease  2. HTN:  Well controlled.  Continue current medications and low sodium Dash type diet.   3. HLD:  Continue crestor target LDL less than 70 4. Smoking:  Quit 2015 lung cancer screening CT 2018 ok repeat yearly  5. LBBB :  No high grade heart block reflective of DCM  6. DCM:  EF 40% by TTE Non ischemic normal filling pressures cath 01/23/18    Current medicines are reviewed at length with the patient today.  The patient does not have concerns regarding medicines.  The following changes have been made:  None   Labs/ tests ordered today include: none   Direct time spent with patient 45 minutes   No orders of the defined types were placed in this encounter.    Disposition:   FU with cardiology  In 6 months     Signed, Frank Rouge, MD  02/03/2018 10:05 AM    [place] [place], [place], [place]  [address] Phone: [phone]; Fax: [phone]

## 2018-02-03 ENCOUNTER — Ambulatory Visit: Payer: PPO | Admitting: Cardiovascular Disease

## 2018-02-03 ENCOUNTER — Encounter: Payer: Self-pay | Admitting: Cardiovascular Disease

## 2018-02-03 VITALS — BP 112/68 | HR 66 | Ht 70.0 in | Wt 198.2 lb

## 2018-02-03 DIAGNOSIS — I2510 Atherosclerotic heart disease of native coronary artery without angina pectoris: Secondary | ICD-10-CM | POA: Diagnosis not present

## 2018-02-03 DIAGNOSIS — I447 Left bundle-branch block, unspecified: Secondary | ICD-10-CM

## 2018-02-03 DIAGNOSIS — E785 Hyperlipidemia, unspecified: Secondary | ICD-10-CM

## 2018-02-03 DIAGNOSIS — I420 Dilated cardiomyopathy: Secondary | ICD-10-CM | POA: Diagnosis not present

## 2018-02-03 NOTE — Patient Instructions (Addendum)

## 2018-02-07 DIAGNOSIS — J069 Acute upper respiratory infection, unspecified: Secondary | ICD-10-CM | POA: Diagnosis not present

## 2018-02-07 DIAGNOSIS — Z6828 Body mass index (BMI) 28.0-28.9, adult: Secondary | ICD-10-CM | POA: Diagnosis not present

## 2018-04-07 DIAGNOSIS — E782 Mixed hyperlipidemia: Secondary | ICD-10-CM | POA: Diagnosis not present

## 2018-04-07 DIAGNOSIS — M109 Gout, unspecified: Secondary | ICD-10-CM | POA: Diagnosis not present

## 2018-04-07 DIAGNOSIS — I10 Essential (primary) hypertension: Secondary | ICD-10-CM | POA: Diagnosis not present

## 2018-04-07 DIAGNOSIS — E559 Vitamin D deficiency, unspecified: Secondary | ICD-10-CM | POA: Diagnosis not present

## 2018-04-08 DIAGNOSIS — L814 Other melanin hyperpigmentation: Secondary | ICD-10-CM | POA: Diagnosis not present

## 2018-04-08 DIAGNOSIS — D2272 Melanocytic nevi of left lower limb, including hip: Secondary | ICD-10-CM | POA: Diagnosis not present

## 2018-04-08 DIAGNOSIS — L821 Other seborrheic keratosis: Secondary | ICD-10-CM | POA: Diagnosis not present

## 2018-04-08 DIAGNOSIS — Z85828 Personal history of other malignant neoplasm of skin: Secondary | ICD-10-CM | POA: Diagnosis not present

## 2018-04-08 DIAGNOSIS — Z23 Encounter for immunization: Secondary | ICD-10-CM | POA: Diagnosis not present

## 2018-04-08 DIAGNOSIS — Z86018 Personal history of other benign neoplasm: Secondary | ICD-10-CM | POA: Diagnosis not present

## 2018-04-08 DIAGNOSIS — D225 Melanocytic nevi of trunk: Secondary | ICD-10-CM | POA: Diagnosis not present

## 2018-04-11 DIAGNOSIS — M109 Gout, unspecified: Secondary | ICD-10-CM | POA: Diagnosis not present

## 2018-04-11 DIAGNOSIS — I509 Heart failure, unspecified: Secondary | ICD-10-CM | POA: Diagnosis not present

## 2018-04-11 DIAGNOSIS — Z6828 Body mass index (BMI) 28.0-28.9, adult: Secondary | ICD-10-CM | POA: Diagnosis not present

## 2018-04-11 DIAGNOSIS — I10 Essential (primary) hypertension: Secondary | ICD-10-CM | POA: Diagnosis not present

## 2018-04-11 DIAGNOSIS — E782 Mixed hyperlipidemia: Secondary | ICD-10-CM | POA: Diagnosis not present

## 2018-05-01 DIAGNOSIS — I10 Essential (primary) hypertension: Secondary | ICD-10-CM | POA: Diagnosis not present

## 2018-05-01 DIAGNOSIS — F419 Anxiety disorder, unspecified: Secondary | ICD-10-CM | POA: Diagnosis not present

## 2018-05-01 DIAGNOSIS — L570 Actinic keratosis: Secondary | ICD-10-CM | POA: Diagnosis not present

## 2018-05-01 DIAGNOSIS — E7800 Pure hypercholesterolemia, unspecified: Secondary | ICD-10-CM | POA: Diagnosis not present

## 2018-05-01 DIAGNOSIS — L309 Dermatitis, unspecified: Secondary | ICD-10-CM | POA: Diagnosis not present

## 2018-06-12 ENCOUNTER — Other Ambulatory Visit (HOSPITAL_COMMUNITY): Payer: Self-pay

## 2018-06-12 NOTE — Telephone Encounter (Signed)
Opened in error

## 2018-07-23 DIAGNOSIS — L821 Other seborrheic keratosis: Secondary | ICD-10-CM | POA: Diagnosis not present

## 2018-07-23 DIAGNOSIS — I872 Venous insufficiency (chronic) (peripheral): Secondary | ICD-10-CM | POA: Diagnosis not present

## 2018-08-28 ENCOUNTER — Other Ambulatory Visit: Payer: Self-pay

## 2018-10-13 DIAGNOSIS — I509 Heart failure, unspecified: Secondary | ICD-10-CM | POA: Diagnosis not present

## 2018-10-13 DIAGNOSIS — E782 Mixed hyperlipidemia: Secondary | ICD-10-CM | POA: Diagnosis not present

## 2018-10-13 DIAGNOSIS — I10 Essential (primary) hypertension: Secondary | ICD-10-CM | POA: Diagnosis not present

## 2018-10-13 DIAGNOSIS — G4700 Insomnia, unspecified: Secondary | ICD-10-CM | POA: Diagnosis not present

## 2018-10-13 DIAGNOSIS — Z0000 Encounter for general adult medical examination without abnormal findings: Secondary | ICD-10-CM | POA: Diagnosis not present

## 2018-10-16 DIAGNOSIS — Z23 Encounter for immunization: Secondary | ICD-10-CM | POA: Diagnosis not present

## 2018-11-10 NOTE — Progress Notes (Signed)
Cardiology Office Note   Date:  11/13/2018   ID:  RUSH BRUST, DOB Dec 08, 1945, MRN [id number]  PCP:  Fanny Bien, MD  Cardiologist:   Jenkins Rouge, MD   No chief complaint on file.     History of Present Illness: Frank Foster is a 73 y.o. male who presents for f/u regarding CAD. He is a former smoker quitting in 2015. History of HTN and HLD. Had screening carotid and calcium score done 10/17/17 Carotids with mild plaque no stenosis Calcium score with mild 3 vessel calcium with score of 205 This was incorrectly labeled as 51 th percentile for age and sex as it is only 99 th percentile for age and sex. He has no cardiac symptoms   He has no chest pain. He is somewhat sedentary Walks 3x/week and golfs a bit. Long discussion with Him about his ECG today showing LBBB. No old ECG to compare. 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 Body mass index (bmi) 28.0-28.9, adult   . Gout   . Hyperlipidemia   . Hypertension     Past Surgical History:  Procedure Laterality Date  . broken nose    . KNEE SURGERY     arthroscopic right knee  . RIGHT/LEFT HEART CATH AND CORONARY ANGIOGRAPHY N/A 01/23/2018   Procedure: RIGHT/LEFT HEART CATH AND CORONARY ANGIOGRAPHY;  Surgeon: Burnell Blanks, MD;  Location: [facility];  Service: Cardiovascular;  Laterality: N/A;     Current Outpatient Medications  Medication Sig Dispense Refill  . allopurinol (ZYLOPRIM) 100 MG tablet Take 100 mg by mouth every evening.     Marland Kitchen aspirin EC 81 MG tablet Take 81 mg by mouth  every evening.    . carvedilol (COREG) 3.125 MG tablet Take 1 tablet (3.125 mg total) by mouth 2 (two) times daily. 180 tablet 3  . colchicine 0.6 MG tablet Take 0.6 mg by mouth as needed (FOR GOUT FLARE UPS).     . rosuvastatin (CRESTOR) 5 MG tablet Take 5 mg by mouth every evening.     . sacubitril-valsartan (ENTRESTO) 24-26 MG Take 1 tablet by mouth 2 (two) times daily. 60 tablet 11   Current Facility-Administered Medications  Medication Dose Route Frequency Provider Last Rate Last Dose  . 0.9 %  sodium chloride infusion  500 mL Intravenous Continuous Ladene Artist, MD        Allergies:   Penicillins    Social History:  The patient  reports that he quit smoking about 5 years ago. His smoking use included cigarettes. He has never used smokeless tobacco. He reports current alcohol use of about 5.0 standard drinks of alcohol per week. He reports that he does not use drugs.   Family History:  The patient's family history includes Heart disease in his father and mother; Prostate cancer in his father.    ROS:  Please see the history of present illness.   Otherwise, review of systems are positive for none.   All other systems are reviewed and  negative.    PHYSICAL EXAM: VS:  BP 114/66   Pulse 73   Ht 5\' 10"  (1.778 m)   Wt 199 lb (90.3 kg)   SpO2 96%   BMI 28.55 kg/m  , BMI Body mass index is 28.55 kg/m. Affect appropriate Healthy:  appears stated age 67: normal Neck supple with no adenopathy JVP normal no bruits no thyromegaly Lungs clear with no wheezing and good diaphragmatic motion Heart:  S1/S2 no murmur, no rub, gallop or click PMI normal Abdomen: benighn, BS positve, no tenderness, no AAA no bruit.  No HSM or HJR Distal pulses intact with no bruits No edema Neuro non-focal Skin warm and dry No muscular weakness Right radial cath sight well healed     EKG:  SR rate 80 LBBB    Recent Labs: 01/16/2018: BUN 13; Creatinine, Ser 0.94; Hemoglobin 15.6; Platelets  241; Potassium 4.7; Sodium 140    Lipid Panel No results found for: CHOL, TRIG, HDL, CHOLHDL, VLDL, LDLCALC, LDLDIRECT    Wt Readings from Last 3 Encounters:  11/13/18 199 lb (90.3 kg)  02/03/18 198 lb 3.2 oz (89.9 kg)  01/23/18 193 lb (87.5 kg)      Other studies Reviewed: Additional studies/ records that were reviewed today include: Notes from Dr Ernie Hew primary Carotid US, Calcium Score Labs and ECG .    ASSESSMENT AND PLAN:  1.  CAD:  Asymptomatic Calcium score of 205 which is about average for age ( 107 th percentile) in stetting  Of LBBB Cath 01/23/18 non obstructive disease  2. HTN:  Well controlled.  Continue current medications and low sodium Dash type diet.   3. HLD:  Continue crestor target LDL less than 70 4. Smoking:  Quit 2015 lung cancer screening CT 2018 ok repeat yearly  5. LBBB :  No high grade heart block reflective of DCM  6. DCM:  EF 40% by TTE Non ischemic normal filling pressures cath 01/23/18  F/u echo in December To re evaluate on medical Rx Increase entresto  Current medicines are reviewed at length with the patient today.  The patient does not have concerns regarding medicines.  The following changes have been made: Increase Entresto    Labs/ tests ordered today include: Echo 12/2018  Direct time spent with patient 30 minutes   No orders of the defined types were placed in this encounter.    Disposition:   FU with cardiology  In 6 months     Signed, Jenkins Rouge, MD  11/13/2018 8:52 AM    [address], [place], [place]  [address] Phone: [phone]; Fax: [phone]

## 2018-11-13 ENCOUNTER — Other Ambulatory Visit: Payer: Self-pay

## 2018-11-13 ENCOUNTER — Encounter: Payer: Self-pay | Admitting: Cardiovascular Disease

## 2018-11-13 ENCOUNTER — Ambulatory Visit: Payer: PPO | Admitting: Cardiovascular Disease

## 2018-11-13 VITALS — BP 114/66 | HR 73 | Ht 70.0 in | Wt 199.0 lb

## 2018-11-13 DIAGNOSIS — I420 Dilated cardiomyopathy: Secondary | ICD-10-CM

## 2018-11-13 MED ORDER — ENTRESTO 49-51 MG PO TABS: 1.0000 | ORAL_TABLET | Freq: Two times a day (BID) | ORAL | 3 refills | Status: DC

## 2018-11-13 NOTE — Patient Instructions (Addendum)
Medication Instructions:  Your physician has recommended you make the following change in your medication:  1-INCREASE Entresto 49-51 mg by mouth twice daily.  *If you need a refill on your cardiac medications before your next appointment, please call your pharmacy*  Lab Work:  If you have labs (blood work) drawn today and your tests are completely normal, you will receive your results only by: Marland Kitchen MyChart Message (if you have MyChart) OR . A paper copy in the mail If you have any lab test that is abnormal or we need to change your treatment, we will call you to review the results.  Testing/Procedures: Your physician has requested that you have an echocardiogram in December. Echocardiography is a painless test that uses sound waves to create images of your heart. It provides your doctor with information about the size and shape of your heart and how well your heart's chambers and valves are working. This procedure takes approximately one hour. There are no restrictions for this procedure.  Follow-Up: At [facility], you and your health needs are our priority.  As part of our continuing mission to provide you with exceptional heart care, we have created designated Provider Care Teams.  These Care Teams include your primary Cardiologist (physician) and Advanced Practice Providers (APPs -  Physician Assistants and Nurse Practitioners) who all work together to provide you with the care you need, when you need it.  Your next appointment:   December on same day as echocardiogram.  The format for your next appointment:   In Person  Provider:   You may see Jenkins Rouge, MD or one of the following Advanced Practice Providers on your designated Care Team:    Truitt Merle, NP  Cecilie Kicks, NP  Kathyrn Drown, NP

## 2018-12-04 DIAGNOSIS — R739 Hyperglycemia, unspecified: Secondary | ICD-10-CM | POA: Diagnosis not present

## 2018-12-04 DIAGNOSIS — Z8042 Family history of malignant neoplasm of prostate: Secondary | ICD-10-CM | POA: Diagnosis not present

## 2018-12-08 DIAGNOSIS — I10 Essential (primary) hypertension: Secondary | ICD-10-CM | POA: Diagnosis not present

## 2018-12-08 DIAGNOSIS — I509 Heart failure, unspecified: Secondary | ICD-10-CM | POA: Diagnosis not present

## 2018-12-08 DIAGNOSIS — R739 Hyperglycemia, unspecified: Secondary | ICD-10-CM | POA: Diagnosis not present

## 2018-12-22 ENCOUNTER — Other Ambulatory Visit: Payer: Self-pay

## 2019-01-02 ENCOUNTER — Telehealth: Payer: Self-pay | Admitting: Cardiovascular Disease

## 2019-01-02 NOTE — Telephone Encounter (Signed)
See My chart message

## 2019-01-02 NOTE — Telephone Encounter (Signed)
Pt's wife would like to be there for his appointments pt's memory is not very good.

## 2019-01-05 NOTE — Progress Notes (Signed)
Cardiology Office Note   Date:  01/07/2019   ID:  Frank Foster, DOB [birth year], MRN [id number]  PCP:  Fanny Bien, MD  Cardiologist:   Jenkins Rouge, MD   No chief complaint on file.     History of Present Illness: Frank Foster is a 73 y.o. male who presents for f/u regarding CAD. He is a former smoker quitting in 2015. History of HTN and HLD. Had screening carotid and calcium score done 10/17/17 Carotids with mild plaque no stenosis Calcium score with mild 3 vessel calcium with score of 205 This was incorrectly labeled as 42 th percentile for age and sex as it is only 61 th percentile for age and sex. He has no cardiac symptoms   He has no chest pain. He is somewhat sedentary Walks 3x/week and golfs a bit. Long discussion with Him about his ECG showing LBBB.   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 Body mass index (bmi) 28.0-28.9, adult   . Gout   . Hyperlipidemia   . Hypertension     Past Surgical History:  Procedure Laterality Date  . broken nose    . KNEE SURGERY     arthroscopic right knee  . RIGHT/LEFT HEART CATH AND CORONARY ANGIOGRAPHY N/A 01/23/2018   Procedure: RIGHT/LEFT HEART CATH AND CORONARY ANGIOGRAPHY;  Surgeon: Burnell Blanks, MD;  Location: [facility];  Service: Cardiovascular;  Laterality: N/A;     Current Outpatient Medications  Medication Sig Dispense Refill  . allopurinol (ZYLOPRIM) 100 MG tablet Take 100 mg by mouth every evening.     Marland Kitchen aspirin EC 81 MG tablet Take  81 mg by mouth every evening.    . carvedilol (COREG) 3.125 MG tablet Take 1 tablet (3.125 mg total) by mouth 2 (two) times daily. 180 tablet 3  . colchicine 0.6 MG tablet Take 0.6 mg by mouth as needed (FOR GOUT FLARE UPS).     . rosuvastatin (CRESTOR) 5 MG tablet Take 5 mg by mouth every evening.     . sacubitril-valsartan (ENTRESTO) 49-51 MG Take 1 tablet by mouth 2 (two) times daily. 180 tablet 3   Current Facility-Administered Medications  Medication Dose Route Frequency Provider Last Rate Last Dose  . 0.9 %  sodium chloride infusion  500 mL Intravenous Continuous Ladene Artist, MD        Allergies:   Penicillins    Social History:  The patient  reports that he quit smoking about 5 years ago. His smoking use included cigarettes. He has never used smokeless tobacco. He reports current alcohol use of about 5.0 standard drinks of alcohol per week. He reports that he does not use drugs.   Family History:  The patient's family history includes Heart disease in his father and mother; Prostate cancer in his father.    ROS:  Please see the history of present illness.   Otherwise, review of systems are positive for none.   All  other systems are reviewed and negative.    PHYSICAL EXAM: VS:  BP 140/88 (BP Location: Left Arm, Patient Position: Sitting, Cuff Size: Normal)   Ht 5\' 10"  (1.778 m)   Wt 199 lb 3.2 oz (90.4 kg)   BMI 28.58 kg/m  , BMI Body mass index is 28.58 kg/m. Affect appropriate Healthy:  appears stated age 53: normal Neck supple with no adenopathy JVP normal no bruits no thyromegaly Lungs clear with no wheezing and good diaphragmatic motion Heart:  S1/S2 no murmur, no rub, gallop or click PMI normal Abdomen: benighn, BS positve, no tenderness, no AAA no bruit.  No HSM or HJR Distal pulses intact with no bruits No edema Neuro non-focal Skin warm and dry No muscular weakness Right radial cath sight well healed     EKG:  SR rate 80 LBBB  01/07/19 SR rate 58  LBBB    Recent Labs: 01/16/2018: BUN 13; Creatinine, Ser 0.94; Hemoglobin 15.6; Platelets 241; Potassium 4.7; Sodium 140    Lipid Panel No results found for: CHOL, TRIG, HDL, CHOLHDL, VLDL, LDLCALC, LDLDIRECT    Wt Readings from Last 3 Encounters:  01/07/19 199 lb 3.2 oz (90.4 kg)  11/13/18 199 lb (90.3 kg)  02/03/18 198 lb 3.2 oz (89.9 kg)      Other studies Reviewed: Additional studies/ records that were reviewed today include: Notes from Dr Ernie Hew primary Carotid US, Calcium Score Labs and ECG .    ASSESSMENT AND PLAN:  1.  CAD:  Asymptomatic Calcium score of 205 which is about average for age ( 62 th percentile) in setting Of LBBB Cath 01/23/18 non obstructive disease  2. HTN:  Well controlled.  Continue current medications and low sodium Dash type diet.   3. HLD:  Continue crestor target LDL less than 70 4. Smoking:  Quit 2015 lung cancer screening CT 2018 ok repeat scan ordered 5. LBBB :  No high grade heart block reflective of DCM  6. DCM:  EF 40% by TTE Non ischemic normal filling pressures cath 01/23/18  F/u echo ordered continue entresto  7. OSA:  Sleep study and referral to Dr Radford Pax ordered    Current medicines are reviewed at length with the patient today.  The patient does not have concerns regarding medicines.  The following changes have been made: Increase Entresto    Labs/ tests ordered today include: Echo 12/2018 Lung cancer CT   Direct time spent with patient 30 minutes   No orders of the defined types were placed in this encounter.    Disposition:   FU with cardiology  In 6 months     Signed, Jenkins Rouge, MD  01/07/2019 9:29 AM    [address], [place], [place]  [address] Phone: [phone]; Fax: [phone]

## 2019-01-07 ENCOUNTER — Encounter: Payer: Self-pay | Admitting: Cardiovascular Disease

## 2019-01-07 ENCOUNTER — Ambulatory Visit: Payer: PPO | Admitting: Cardiovascular Disease

## 2019-01-07 ENCOUNTER — Other Ambulatory Visit: Payer: Self-pay

## 2019-01-07 ENCOUNTER — Ambulatory Visit (HOSPITAL_COMMUNITY): Payer: PPO | Attending: Internal Medicine

## 2019-01-07 VITALS — BP 140/88 | HR 52 | Ht 70.0 in | Wt 199.2 lb

## 2019-01-07 DIAGNOSIS — I420 Dilated cardiomyopathy: Secondary | ICD-10-CM | POA: Insufficient documentation

## 2019-01-07 DIAGNOSIS — I2510 Atherosclerotic heart disease of native coronary artery without angina pectoris: Secondary | ICD-10-CM

## 2019-01-07 DIAGNOSIS — Z122 Encounter for screening for malignant neoplasm of respiratory organs: Secondary | ICD-10-CM

## 2019-01-07 NOTE — Patient Instructions (Addendum)
Medication Instructions:   *If you need a refill on your cardiac medications before your next appointment, please call your pharmacy*  Lab Work:  If you have labs (blood work) drawn today and your tests are completely normal, you will receive your results only by: Marland Kitchen MyChart Message (if you have MyChart) OR . A paper copy in the mail If you have any lab test that is abnormal or we need to change your treatment, we will call you to review the results.  Testing/Procedures: Non-Cardiac CT scanning for Lung Cancer, (CAT scanning), is a noninvasive, special x-ray that produces cross-sectional images of the body using x-rays and a computer. CT scans help physicians diagnose and treat medical conditions. For some CT exams, a contrast material is used to enhance visibility in the area of the body being studied. CT scans provide greater clarity and reveal more details than regular x-ray exams.  Your physician has recommended that you have a sleep study. This test records several body functions during sleep, including: brain activity, eye movement, oxygen and carbon dioxide blood levels, heart rate and rhythm, breathing rate and rhythm, the flow of air through your mouth and nose, snoring, body muscle movements, and chest and belly movement.  Follow-Up: At [facility], you and your health needs are our priority.  As part of our continuing mission to provide you with exceptional heart care, we have created designated Provider Care Teams.  These Care Teams include your primary Cardiologist (physician) and Advanced Practice Providers (APPs -  Physician Assistants and Nurse Practitioners) who all work together to provide you with the care you need, when you need it.  Your next appointment:   6 month(s)  The format for your next appointment:   In Person  Provider:   You may see Jenkins Rouge, MD or one of the following Advanced Practice Providers on your designated Care Team:    Truitt Merle,  NP  Cecilie Kicks, NP  Kathyrn Drown, NP  You have been referred to Dr. Radford Pax the sleep doctor.

## 2019-01-08 ENCOUNTER — Telehealth: Payer: Self-pay | Admitting: *Deleted

## 2019-01-08 NOTE — Telephone Encounter (Signed)
-----   Message from Earnestine Mealing sent at 01/07/2019 10:14 AM EST ----- Regarding: Sleep Study Order for sleep study and referral to Dr Radford Pax

## 2019-01-20 ENCOUNTER — Ambulatory Visit (INDEPENDENT_AMBULATORY_CARE_PROVIDER_SITE_OTHER)
Admission: RE | Admit: 2019-01-20 | Discharge: 2019-01-20 | Disposition: A | Discharge status: Home / Self Care | Payer: PPO | Source: Ambulatory Visit | Attending: Cardiovascular Disease | Admitting: Cardiovascular Disease

## 2019-01-20 ENCOUNTER — Other Ambulatory Visit: Payer: Self-pay

## 2019-01-20 DIAGNOSIS — Z87891 Personal history of nicotine dependence: Secondary | ICD-10-CM | POA: Diagnosis not present

## 2019-01-20 DIAGNOSIS — Z122 Encounter for screening for malignant neoplasm of respiratory organs: Secondary | ICD-10-CM

## 2019-01-21 ENCOUNTER — Telehealth: Payer: Self-pay | Admitting: *Deleted

## 2019-01-21 ENCOUNTER — Other Ambulatory Visit: Payer: Self-pay | Admitting: *Deleted

## 2019-01-21 DIAGNOSIS — Z122 Encounter for screening for malignant neoplasm of respiratory organs: Secondary | ICD-10-CM

## 2019-01-21 NOTE — Telephone Encounter (Signed)
Staff message sent to Frank Foster received. Ok to schedule sleep study. HTA Auth # [id number]. Valid dates 01/21/19 to 04/21/19.

## 2019-01-29 ENCOUNTER — Telehealth: Payer: Self-pay | Admitting: *Deleted

## 2019-01-29 NOTE — Telephone Encounter (Signed)
Patient is scheduled for lab study on 02/19/19. Pt is scheduled for COVID screening on 02/16/19. Patient understands his sleep study will be done at [facility] sleep lab. Patient understands he will receive a sleep packet in a week or so. Patient understands to call if he does not receive the sleep packet in a timely manner.  Patient has refused testing due to covid 19. Patient states he will not go into the hospital unless he absolutely has too.  Patient will go at a later date and he contact out office.

## 2019-01-29 NOTE — Telephone Encounter (Signed)
-----   Message from Lauralee Evener, [place] sent at 01/21/2019  1:28 PM EST ----- Regarding: RE: Sleep Study HTA auth received. Ok to schedule. Auth # [id number]. Valid dates 01/21/19 to 04/21/19. ----- Message ----- From: Freada Bergeron, CMA Sent: 01/08/2019   5:15 PM EST To: [facility] Subject: FW: Sleep Study                                 ----- Message ----- From: Earnestine Mealing Sent: 01/07/2019  10:14 AM EST To: Freada Bergeron, CMA Subject: Sleep Study                                    Order for sleep study and referral to Dr Radford Pax

## 2019-02-16 ENCOUNTER — Other Ambulatory Visit (HOSPITAL_COMMUNITY): Payer: PPO

## 2019-02-19 ENCOUNTER — Encounter (HOSPITAL_BASED_OUTPATIENT_CLINIC_OR_DEPARTMENT_OTHER): Payer: PPO | Admitting: Cardiology

## 2019-03-20 ENCOUNTER — Ambulatory Visit: Payer: PPO

## 2019-04-13 DIAGNOSIS — L814 Other melanin hyperpigmentation: Secondary | ICD-10-CM | POA: Diagnosis not present

## 2019-04-13 DIAGNOSIS — L578 Other skin changes due to chronic exposure to nonionizing radiation: Secondary | ICD-10-CM | POA: Diagnosis not present

## 2019-04-13 DIAGNOSIS — L821 Other seborrheic keratosis: Secondary | ICD-10-CM | POA: Diagnosis not present

## 2019-04-13 DIAGNOSIS — D2272 Melanocytic nevi of left lower limb, including hip: Secondary | ICD-10-CM | POA: Diagnosis not present

## 2019-04-13 DIAGNOSIS — D225 Melanocytic nevi of trunk: Secondary | ICD-10-CM | POA: Diagnosis not present

## 2019-04-13 DIAGNOSIS — D485 Neoplasm of uncertain behavior of skin: Secondary | ICD-10-CM | POA: Diagnosis not present

## 2019-04-13 DIAGNOSIS — Z85828 Personal history of other malignant neoplasm of skin: Secondary | ICD-10-CM | POA: Diagnosis not present

## 2019-04-13 DIAGNOSIS — D0362 Melanoma in situ of left upper limb, including shoulder: Secondary | ICD-10-CM | POA: Diagnosis not present

## 2019-04-13 DIAGNOSIS — Z86018 Personal history of other benign neoplasm: Secondary | ICD-10-CM | POA: Diagnosis not present

## 2019-04-29 DIAGNOSIS — D0359 Melanoma in situ of other part of trunk: Secondary | ICD-10-CM | POA: Diagnosis not present

## 2019-04-29 DIAGNOSIS — D0362 Melanoma in situ of left upper limb, including shoulder: Secondary | ICD-10-CM | POA: Diagnosis not present

## 2019-05-07 DIAGNOSIS — M109 Gout, unspecified: Secondary | ICD-10-CM | POA: Diagnosis not present

## 2019-05-07 DIAGNOSIS — I10 Essential (primary) hypertension: Secondary | ICD-10-CM | POA: Diagnosis not present

## 2019-05-11 DIAGNOSIS — C439 Malignant melanoma of skin, unspecified: Secondary | ICD-10-CM | POA: Diagnosis not present

## 2019-05-11 DIAGNOSIS — I10 Essential (primary) hypertension: Secondary | ICD-10-CM | POA: Diagnosis not present

## 2019-05-11 DIAGNOSIS — E663 Overweight: Secondary | ICD-10-CM | POA: Diagnosis not present

## 2019-05-11 DIAGNOSIS — M109 Gout, unspecified: Secondary | ICD-10-CM | POA: Diagnosis not present

## 2019-05-11 DIAGNOSIS — K219 Gastro-esophageal reflux disease without esophagitis: Secondary | ICD-10-CM | POA: Diagnosis not present

## 2019-06-30 DIAGNOSIS — M79642 Pain in left hand: Secondary | ICD-10-CM | POA: Diagnosis not present

## 2019-06-30 DIAGNOSIS — M25532 Pain in left wrist: Secondary | ICD-10-CM | POA: Diagnosis not present

## 2019-07-09 DIAGNOSIS — I10 Essential (primary) hypertension: Secondary | ICD-10-CM | POA: Diagnosis not present

## 2019-07-09 DIAGNOSIS — Z8042 Family history of malignant neoplasm of prostate: Secondary | ICD-10-CM | POA: Diagnosis not present

## 2019-07-13 DIAGNOSIS — Z125 Encounter for screening for malignant neoplasm of prostate: Secondary | ICD-10-CM | POA: Diagnosis not present

## 2019-07-13 DIAGNOSIS — R739 Hyperglycemia, unspecified: Secondary | ICD-10-CM | POA: Diagnosis not present

## 2019-07-13 DIAGNOSIS — I10 Essential (primary) hypertension: Secondary | ICD-10-CM | POA: Diagnosis not present

## 2019-07-13 DIAGNOSIS — E663 Overweight: Secondary | ICD-10-CM | POA: Diagnosis not present

## 2019-08-03 NOTE — Progress Notes (Signed)
Cardiology Office Note   Date:  08/04/2019   ID:  Frank Foster, DOB [birth year], MRN [id number]  PCP:  Fanny Bien, MD  Cardiologist:   Jenkins Rouge, MD   No chief complaint on file.     History of Present Illness: Frank Foster is a 74 y.o. male who presents for f/u regarding CAD/DCM. He is a former smoker quitting in 2015. History of HTN and HLD. Had screening carotid and calcium score done 10/17/17 Carotids with mild plaque no stenosis Calcium score with mild 3 vessel calcium with score of 205 This was incorrectly labeled as 9 th percentile for age and sex as it is only 4 th percentile for age and sex. He has no cardiac symptoms   He has no chest pain. He is somewhat sedentary Walks 3x/week and golfs a bit. Long discussion with Him about his ECG showing LBBB.   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 OSA wife indicates he snores loudly  Deferred.   Weight up needs to have less carbs Does walk a lot   Past Medical History:  Diagnosis Date   Body mass index (bmi) 28.0-28.9, adult    Gout    Hyperlipidemia    Hypertension     Past Surgical History:  Procedure Laterality Date   broken nose     KNEE SURGERY     arthroscopic right knee   RIGHT/LEFT HEART CATH AND CORONARY ANGIOGRAPHY N/A 01/23/2018   Procedure: RIGHT/LEFT HEART CATH AND CORONARY ANGIOGRAPHY;  Surgeon: Burnell Blanks, MD;  Location: [facility];  Service: Cardiovascular;  Laterality:  N/A;     Current Outpatient Medications  Medication Sig Dispense Refill   allopurinol (ZYLOPRIM) 100 MG tablet Take 100 mg by mouth every evening.      aspirin EC 81 MG tablet Take 81 mg by mouth every evening.     carvedilol (COREG) 3.125 MG tablet Take 1 tablet (3.125 mg total) by mouth 2 (two) times daily. 180 tablet 3   colchicine 0.6 MG tablet Take 0.6 mg by mouth as needed (FOR GOUT FLARE UPS).      rosuvastatin (CRESTOR) 5 MG tablet Take 5 mg by mouth every evening.      sacubitril-valsartan (ENTRESTO) 49-51 MG Take 1 tablet by mouth 2 (two) times daily. 180 tablet 3   Current Facility-Administered Medications  Medication Dose Route Frequency Provider Last Rate Last Admin   0.9 %  sodium chloride infusion  500 mL Intravenous Continuous Ladene Artist, MD        Allergies:   Penicillins    Social History:  The patient  reports that he quit smoking about 6 years ago. His smoking use included cigarettes. He has never used smokeless tobacco. He reports current alcohol use of about 5.0 standard drinks of alcohol per week. He reports that he does not use drugs.   Family History:  The patient's family history includes Heart  disease in his father and mother; Prostate cancer in his father.    ROS:  Please see the history of present illness.   Otherwise, review of systems are positive for none.   All other systems are reviewed and negative.    PHYSICAL EXAM: VS:  BP 138/82    Pulse 81    Ht 5\' 10"  (1.778 m)    Wt 201 lb (91.2 kg)    SpO2 98%    BMI 28.84 kg/m  , BMI Body mass index is 28.84 kg/m. Affect appropriate Healthy:  appears stated age 64: normal Neck supple with no adenopathy JVP normal no bruits no thyromegaly Lungs clear with no wheezing and good diaphragmatic motion Heart:  S1/S2 no murmur, no rub, gallop or click PMI normal Abdomen: benighn, BS positve, no tenderness, no AAA no bruit.  No HSM or HJR Distal pulses intact with no bruits No edema Neuro  non-focal Skin warm and dry No muscular weakness Right radial cath sight well healed     EKG:  SR rate 80 LBBB  01/07/19 SR rate 58 LBBB    Recent Labs: No results found for requested labs within last 8760 hours.    Lipid Panel No results found for: CHOL, TRIG, HDL, CHOLHDL, VLDL, LDLCALC, LDLDIRECT    Wt Readings from Last 3 Encounters:  08/04/19 201 lb (91.2 kg)  01/07/19 199 lb 3.2 oz (90.4 kg)  11/13/18 199 lb (90.3 kg)      Other studies Reviewed: Additional studies/ records that were reviewed today include: Notes from Dr Ernie Hew primary Carotid US, Calcium Score Labs and ECG .    ASSESSMENT AND PLAN:  1.  CAD:  Asymptomatic Calcium score of 205 which is about average for age ( 37 th percentile)  Cath 01/23/18 non obstructive disease   2. HTN:  Well controlled.  Continue current medications and low sodium Dash type diet.    3. HLD:  Continue crestor target LDL less than 70  4. Smoking:  Quit 2015 lung cancer screening CT 01/20/19 ok   5. LBBB :  No high grade heart block reflective of DCM   6. DCM:  EF 40% by TTE 01/07/19  Non ischemic normal filling pressures cath 01/23/18  F/u echo in a  Year continue entresto   7. OSA:  Sleep study and referral to Dr Radford Pax ordered patient prefers to defer     Current medicines are reviewed at length with the patient today.  The patient does not have concerns regarding medicines.  The following changes have been made: None   Labs/ tests ordered today include: Echo 12//2021   Direct time spent with patient 30 minutes   No orders of the defined types were placed in this encounter.    Disposition:   FU with cardiology  In a year     Signed, Jenkins Rouge, MD  08/04/2019 2:59 PM    [address], [place], [place]  [address] Phone: [phone]; Fax: [phone]

## 2019-08-04 ENCOUNTER — Ambulatory Visit: Payer: PPO | Admitting: Cardiovascular Disease

## 2019-08-04 ENCOUNTER — Encounter: Payer: Self-pay | Admitting: Cardiovascular Disease

## 2019-08-04 ENCOUNTER — Other Ambulatory Visit: Payer: Self-pay

## 2019-08-04 VITALS — BP 138/82 | HR 81 | Ht 70.0 in | Wt 201.0 lb

## 2019-08-04 DIAGNOSIS — I420 Dilated cardiomyopathy: Secondary | ICD-10-CM

## 2019-08-04 NOTE — Patient Instructions (Addendum)
Medication Instructions:  Your physician recommends that you continue on your current medications as directed. Please refer to the Current Medication list given to you today.  Labwork: NONE  Testing/Procedures: Your physician has requested that you have an echocardiogram in 1 year. Echocardiography is a painless test that uses sound waves to create images of your heart. It provides your doctor with information about the size and shape of your heart and how well your heart's chambers and valves are working. This procedure takes approximately one hour. There are no restrictions for this procedure.  Follow-Up: Your physician wants you to follow-up in: 12 months with Dr. Nishan. You will receive a reminder letter in the mail two months in advance. If you don't receive a letter, please call our office to schedule the follow-up appointment.   If you need a refill on your cardiac medications before your next appointment, please call your pharmacy.    

## 2019-08-12 DIAGNOSIS — I10 Essential (primary) hypertension: Secondary | ICD-10-CM | POA: Diagnosis not present

## 2019-08-12 DIAGNOSIS — R739 Hyperglycemia, unspecified: Secondary | ICD-10-CM | POA: Diagnosis not present

## 2019-08-14 DIAGNOSIS — I10 Essential (primary) hypertension: Secondary | ICD-10-CM | POA: Diagnosis not present

## 2019-08-14 DIAGNOSIS — R739 Hyperglycemia, unspecified: Secondary | ICD-10-CM | POA: Diagnosis not present

## 2019-08-14 DIAGNOSIS — E663 Overweight: Secondary | ICD-10-CM | POA: Diagnosis not present

## 2019-08-25 ENCOUNTER — Other Ambulatory Visit: Payer: Self-pay

## 2019-08-26 ENCOUNTER — Other Ambulatory Visit: Payer: Self-pay | Admitting: Family Medicine

## 2019-08-26 DIAGNOSIS — K828 Other specified diseases of gallbladder: Secondary | ICD-10-CM | POA: Diagnosis not present

## 2019-08-26 DIAGNOSIS — R109 Unspecified abdominal pain: Secondary | ICD-10-CM

## 2019-08-26 DIAGNOSIS — K838 Other specified diseases of biliary tract: Secondary | ICD-10-CM | POA: Diagnosis not present

## 2019-08-26 DIAGNOSIS — I10 Essential (primary) hypertension: Secondary | ICD-10-CM | POA: Diagnosis not present

## 2019-08-26 DIAGNOSIS — K219 Gastro-esophageal reflux disease without esophagitis: Secondary | ICD-10-CM | POA: Diagnosis not present

## 2019-08-26 DIAGNOSIS — K8020 Calculus of gallbladder without cholecystitis without obstruction: Secondary | ICD-10-CM | POA: Diagnosis not present

## 2019-08-27 ENCOUNTER — Other Ambulatory Visit: Payer: Self-pay

## 2019-08-27 ENCOUNTER — Observation Stay (HOSPITAL_COMMUNITY)
Admission: EM | Admit: 2019-08-27 | Discharge: 2019-08-28 | Disposition: A | Discharge status: Home / Self Care | Payer: PPO | Attending: Surgery | Admitting: Surgery

## 2019-08-27 ENCOUNTER — Encounter (HOSPITAL_COMMUNITY): Payer: Self-pay

## 2019-08-27 DIAGNOSIS — Z79899 Other long term (current) drug therapy: Secondary | ICD-10-CM | POA: Diagnosis not present

## 2019-08-27 DIAGNOSIS — K8020 Calculus of gallbladder without cholecystitis without obstruction: Secondary | ICD-10-CM | POA: Diagnosis not present

## 2019-08-27 DIAGNOSIS — I5042 Chronic combined systolic (congestive) and diastolic (congestive) heart failure: Secondary | ICD-10-CM | POA: Diagnosis not present

## 2019-08-27 DIAGNOSIS — Z7982 Long term (current) use of aspirin: Secondary | ICD-10-CM | POA: Insufficient documentation

## 2019-08-27 DIAGNOSIS — I447 Left bundle-branch block, unspecified: Secondary | ICD-10-CM | POA: Diagnosis not present

## 2019-08-27 DIAGNOSIS — M549 Dorsalgia, unspecified: Secondary | ICD-10-CM | POA: Insufficient documentation

## 2019-08-27 DIAGNOSIS — E860 Dehydration: Secondary | ICD-10-CM | POA: Diagnosis not present

## 2019-08-27 DIAGNOSIS — Z20822 Contact with and (suspected) exposure to covid-19: Secondary | ICD-10-CM | POA: Diagnosis not present

## 2019-08-27 DIAGNOSIS — R110 Nausea: Secondary | ICD-10-CM | POA: Diagnosis not present

## 2019-08-27 DIAGNOSIS — R1032 Left lower quadrant pain: Secondary | ICD-10-CM | POA: Diagnosis present

## 2019-08-27 DIAGNOSIS — R1011 Right upper quadrant pain: Secondary | ICD-10-CM | POA: Diagnosis not present

## 2019-08-27 DIAGNOSIS — Z87891 Personal history of nicotine dependence: Secondary | ICD-10-CM | POA: Insufficient documentation

## 2019-08-27 DIAGNOSIS — K810 Acute cholecystitis: Secondary | ICD-10-CM | POA: Diagnosis not present

## 2019-08-27 DIAGNOSIS — K819 Cholecystitis, unspecified: Secondary | ICD-10-CM

## 2019-08-27 DIAGNOSIS — I10 Essential (primary) hypertension: Secondary | ICD-10-CM | POA: Insufficient documentation

## 2019-08-27 LAB — SARS CORONAVIRUS 2 BY RT PCR (HOSPITAL ORDER, PERFORMED IN ~~LOC~~ HOSPITAL LAB): SARS Coronavirus 2: NEGATIVE

## 2019-08-27 LAB — CBC
HCT: 49.6 % (ref 39.0–52.0)
Hemoglobin: 16.2 g/dL (ref 13.0–17.0)
MCH: 32.6 pg (ref 26.0–34.0)
MCHC: 32.7 g/dL (ref 30.0–36.0)
MCV: 99.8 fL (ref 80.0–100.0)
Platelets: 233 10*3/uL (ref 150–400)
RBC: 4.97 MIL/uL (ref 4.22–5.81)
RDW: 13.2 % (ref 11.5–15.5)
WBC: 9.6 10*3/uL (ref 4.0–10.5)
nRBC: 0 % (ref 0.0–0.2)

## 2019-08-27 LAB — URINALYSIS, ROUTINE W REFLEX MICROSCOPIC
Bilirubin Urine: NEGATIVE
Glucose, UA: NEGATIVE mg/dL
Hgb urine dipstick: NEGATIVE
Ketones, ur: 20 mg/dL — AB
Leukocytes,Ua: NEGATIVE
Nitrite: NEGATIVE
Protein, ur: NEGATIVE mg/dL
Specific Gravity, Urine: 1.028 (ref 1.005–1.030)
pH: 5 (ref 5.0–8.0)

## 2019-08-27 LAB — COMPREHENSIVE METABOLIC PANEL
ALT: 99 U/L — ABNORMAL HIGH (ref 0–44)
AST: 54 U/L — ABNORMAL HIGH (ref 15–41)
Albumin: 4.5 g/dL (ref 3.5–5.0)
Alkaline Phosphatase: 105 U/L (ref 38–126)
Anion gap: 11 (ref 5–15)
BUN: 23 mg/dL (ref 8–23)
CO2: 28 mmol/L (ref 22–32)
Calcium: 9.6 mg/dL (ref 8.9–10.3)
Chloride: 100 mmol/L (ref 98–111)
Creatinine, Ser: 1.05 mg/dL (ref 0.61–1.24)
GFR calc Af Amer: >60 mL/min (ref >60)
GFR calc non Af Amer: >60 mL/min (ref >60)
Glucose, Bld: 118 mg/dL — ABNORMAL HIGH (ref 70–99)
Potassium: 4.4 mmol/L (ref 3.5–5.1)
Sodium: 139 mmol/L (ref 135–145)
Total Bilirubin: 1.1 mg/dL (ref 0.3–1.2)
Total Protein: 7.9 g/dL (ref 6.5–8.1)

## 2019-08-27 LAB — LIPASE, BLOOD: Lipase: 87 U/L — ABNORMAL HIGH (ref 11–51)

## 2019-08-27 LAB — SURGICAL PCR SCREEN
MRSA, PCR: NEGATIVE
Staphylococcus aureus: NEGATIVE

## 2019-08-27 MED ORDER — CIPROFLOXACIN IN D5W 400 MG/200ML IV SOLN
400.0000 mg | Freq: Two times a day (BID) | INTRAVENOUS | Status: DC
  Administered 2019-08-27 – 2019-08-28 (×2): 400 mg via INTRAVENOUS
  Filled 2019-08-27 (×3): qty 200

## 2019-08-27 MED ORDER — SODIUM CHLORIDE 0.9 % IV SOLN: INTRAVENOUS | Status: DC

## 2019-08-27 MED ORDER — CARVEDILOL 3.125 MG PO TABS
3.1250 mg | ORAL_TABLET | Freq: Two times a day (BID) | ORAL | Status: DC
  Administered 2019-08-27 – 2019-08-28 (×2): 3.125 mg via ORAL
  Filled 2019-08-27 (×2): qty 1

## 2019-08-27 MED ORDER — ONDANSETRON HCL 4 MG/2ML IJ SOLN
4.0000 mg | Freq: Once | INTRAMUSCULAR | Status: AC
  Administered 2019-08-27: 4 mg via INTRAVENOUS
  Filled 2019-08-27: qty 2

## 2019-08-27 MED ORDER — SODIUM CHLORIDE 0.9 % IV SOLN
2.0000 g | Freq: Once | INTRAVENOUS | Status: AC
  Administered 2019-08-27: 2 g via INTRAVENOUS
  Filled 2019-08-27: qty 2

## 2019-08-27 MED ORDER — ACETAMINOPHEN 500 MG PO TABS
1000.0000 mg | ORAL_TABLET | ORAL | Status: DC
  Filled 2019-08-27: qty 2

## 2019-08-27 MED ORDER — SIMETHICONE 80 MG PO CHEW
40.0000 mg | CHEWABLE_TABLET | Freq: Four times a day (QID) | ORAL | Status: DC | PRN
  Filled 2019-08-27: qty 1

## 2019-08-27 MED ORDER — METHOCARBAMOL 500 MG PO TABS: 500.0000 mg | ORAL_TABLET | Freq: Four times a day (QID) | ORAL | Status: DC | PRN

## 2019-08-27 MED ORDER — ZOLPIDEM TARTRATE 5 MG PO TABS
5.0000 mg | ORAL_TABLET | Freq: Every day | ORAL | Status: DC
  Administered 2019-08-27: 5 mg via ORAL
  Filled 2019-08-27: qty 1

## 2019-08-27 MED ORDER — ACETAMINOPHEN 325 MG PO TABS: 650.0000 mg | ORAL_TABLET | Freq: Four times a day (QID) | ORAL | Status: DC | PRN

## 2019-08-27 MED ORDER — MORPHINE SULFATE (PF) 4 MG/ML IV SOLN
4.0000 mg | Freq: Once | INTRAVENOUS | Status: AC
  Administered 2019-08-27: 4 mg via INTRAVENOUS
  Filled 2019-08-27: qty 1

## 2019-08-27 MED ORDER — OXYCODONE-ACETAMINOPHEN 5-325 MG PO TABS: 1.0000 | ORAL_TABLET | ORAL | Status: DC | PRN

## 2019-08-27 MED ORDER — SODIUM CHLORIDE 0.9% FLUSH
3.0000 mL | Freq: Once | INTRAVENOUS | Status: AC
  Administered 2019-08-27: 3 mL via INTRAVENOUS

## 2019-08-27 MED ORDER — SACUBITRIL-VALSARTAN 24-26 MG PO TABS
1.0000 | ORAL_TABLET | Freq: Two times a day (BID) | ORAL | Status: DC
  Administered 2019-08-27: 1 via ORAL
  Filled 2019-08-27 (×2): qty 1

## 2019-08-27 MED ORDER — DIPHENHYDRAMINE HCL 50 MG/ML IJ SOLN: 12.5000 mg | Freq: Four times a day (QID) | INTRAMUSCULAR | Status: DC | PRN

## 2019-08-27 MED ORDER — ACETAMINOPHEN 650 MG RE SUPP: 650.0000 mg | Freq: Four times a day (QID) | RECTAL | Status: DC | PRN

## 2019-08-27 MED ORDER — ONDANSETRON 4 MG PO TBDP: 4.0000 mg | ORAL_TABLET | Freq: Four times a day (QID) | ORAL | Status: DC | PRN

## 2019-08-27 MED ORDER — ONDANSETRON HCL 4 MG/2ML IJ SOLN: 4.0000 mg | Freq: Four times a day (QID) | INTRAMUSCULAR | Status: DC | PRN

## 2019-08-27 MED ORDER — OXYCODONE HCL 5 MG PO TABS: 5.0000 mg | ORAL_TABLET | ORAL | Status: DC | PRN

## 2019-08-27 MED ORDER — MORPHINE SULFATE (PF) 2 MG/ML IV SOLN: 2.0000 mg | INTRAVENOUS | Status: DC | PRN

## 2019-08-27 MED ORDER — POLYETHYLENE GLYCOL 3350 17 G PO PACK: 17.0000 g | PACK | Freq: Every day | ORAL | Status: DC | PRN

## 2019-08-27 MED ORDER — PANTOPRAZOLE SODIUM 40 MG IV SOLR
40.0000 mg | Freq: Every day | INTRAVENOUS | Status: DC
  Administered 2019-08-27: 40 mg via INTRAVENOUS
  Filled 2019-08-27: qty 40

## 2019-08-27 MED ORDER — DIPHENHYDRAMINE HCL 12.5 MG/5ML PO ELIX: 12.5000 mg | ORAL_SOLUTION | Freq: Four times a day (QID) | ORAL | Status: DC | PRN

## 2019-08-27 MED ORDER — GABAPENTIN 300 MG PO CAPS: 300.0000 mg | ORAL_CAPSULE | ORAL | Status: DC

## 2019-08-27 MED ORDER — DOCUSATE SODIUM 100 MG PO CAPS: 100.0000 mg | ORAL_CAPSULE | Freq: Two times a day (BID) | ORAL | Status: DC

## 2019-08-27 MED ORDER — ALLOPURINOL 100 MG PO TABS
100.0000 mg | ORAL_TABLET | Freq: Every evening | ORAL | Status: DC
  Filled 2019-08-27 (×2): qty 1

## 2019-08-27 MED ORDER — ENOXAPARIN SODIUM 40 MG/0.4ML ~~LOC~~ SOLN
40.0000 mg | SUBCUTANEOUS | Status: DC
  Administered 2019-08-27: 40 mg via SUBCUTANEOUS
  Filled 2019-08-27: qty 0.4

## 2019-08-27 MED ORDER — METOPROLOL TARTRATE 5 MG/5ML IV SOLN: 5.0000 mg | Freq: Four times a day (QID) | INTRAVENOUS | Status: DC | PRN

## 2019-08-27 MED ORDER — METRONIDAZOLE IN NACL 5-0.79 MG/ML-% IV SOLN
500.0000 mg | Freq: Once | INTRAVENOUS | Status: AC
  Administered 2019-08-27: 500 mg via INTRAVENOUS
  Filled 2019-08-27: qty 100

## 2019-08-27 NOTE — Plan of Care (Signed)

## 2019-08-27 NOTE — ED Triage Notes (Signed)
Patient c/o mid upper abdominal pain that radiates into the right shoulder x 2 days Patient had a CT abdomen yesterday and it showed multiple gall stones.

## 2019-08-27 NOTE — Consult Note (Addendum)
Cardiology Consultation:   Patient ID: LAKODA MCANANY; [id number]; [birth year]   Admit date: 08/27/2019 Date of Consult: 08/27/2019  Primary Care Provider: Fanny Bien, MD Primary Cardiologist: Dr. Johnsie Cancel, MD   Patient Profile:   Frank Foster is a 74 y.o. male with a hx of CAD with a calcium score at 205, LBBB, DCM with an EF of 40% by echocardiogram 12/2018 on Entresto, previous tobacco use, HTN, HLD, known LBBB and OSA who is being seen today for preoperative evaluation prior to laparoscopic cholecystectomy at the request of Dr. Hassell Done.  History of Present Illness:   Frank Foster is a 74yo M with a hx as stated above who presented to [facility] on 08/27/19 with a 2 day hx of epigastric and RUQ abdominal pain. He was seen by his PCP via tele-health visit and was scheduled for an OP CT at [facility] which showed multiple gallstones in the gallbladder with wall thickening and inflammatory changes, compatible with acute cholecystitis. Given this, he was contacted and sent to the ED.    In the ED, WBC was 9.6 with elevated lipase at 87. AST/ALT found to be 54/99. General surgery was consulted possible surgical intervention. He was started on IV Abx with anticipation of laparoscopic cholecystectomy once clearance per cardiology.    Mr. Millea is followed by Dr. Johnsie Cancel for his cardiology care. Echocardiogram from 01/15/18 with LVEF of 40% with subsequent Myoview stress testing 01/15/18 with fixed anteroseptal, apicalanterior defect and an LVEF at 36%. LHC performed 01/23/18 with no CAD and normal filling pressures with a PMWP at 82mmHg.   The patient does not have any unstable cardiac conditions. Upon evaluation today, he can achieve 4 METs or greater without anginal symptoms. Pt reports he is relatively active at baseline, walking with his wife several 5-8 miles per week. He has no complaints of LE edema, orthopnea or other s/s of fluid volume overload. As below, he has undergo extensive cardiac  workup with stress testing and subsequent LHC which showed mild, non-obstructive CAD. According to [facility] and AHA guidelines, he requires no further cardiac workup prior to his noncardiac surgery and should be at acceptable risk. His revised cardiac risk of peri-procedural MI or cardiac arrest following surgery is low. Our service is available as necessary in the perioperative period.  Past Medical History:  Diagnosis Date  . Body mass index (bmi) 28.0-28.9, adult   . Gout   . Hyperlipidemia   . Hypertension     Past Surgical History:  Procedure Laterality Date  . broken nose    . KNEE SURGERY     arthroscopic right knee  . RIGHT/LEFT HEART CATH AND CORONARY ANGIOGRAPHY N/A 01/23/2018   Procedure: RIGHT/LEFT HEART CATH AND CORONARY ANGIOGRAPHY;  Surgeon: Burnell Blanks, MD;  Location: [place] [facility];  Service: Cardiovascular;  Laterality: N/A;     Prior to Admission medications   Medication Sig Start Date End Date Taking? Authorizing Provider  acetaminophen (TYLENOL) 325 MG tablet Take 650 mg by mouth every 6 (six) hours as needed for mild pain or headache.   Yes [provider]  allopurinol (ZYLOPRIM) 100 MG tablet Take 100 mg by mouth every evening.    Yes [provider]  aspirin EC 81 MG tablet Take 81 mg by mouth every evening.   Yes [provider]  carvedilol (COREG) 3.125 MG tablet Take 1 tablet (3.125 mg total) by mouth 2 (two) times daily. 01/15/18  Yes Josue Hector, MD  rosuvastatin (CRESTOR) 5 MG tablet Take 5 mg by mouth every evening.    Yes [provider]  sacubitril-valsartan (ENTRESTO) 24-26 MG Take 1 tablet by mouth 2 (two) times daily.   Yes [provider]  zolpidem (AMBIEN) 5 MG tablet Take 5 mg by mouth at bedtime. 08/14/19  Yes [provider]  sacubitril-valsartan (ENTRESTO) 49-51 MG Take 1 tablet by mouth 2 (two) times daily. Patient not taking: Reported on 08/27/2019 11/13/18   Josue Hector, MD     Inpatient Medications: Scheduled Meds: . [START ON 08/28/2019] acetaminophen  1,000 mg Oral On Call to OR  . allopurinol  100 mg Oral QPM  . carvedilol  3.125 mg Oral BID  . docusate sodium  100 mg Oral BID  . enoxaparin (LOVENOX) injection  40 mg Subcutaneous Q24H  . [START ON 08/28/2019] gabapentin  300 mg Oral On Call to OR  . pantoprazole (PROTONIX) IV  40 mg Intravenous QHS  . sacubitril-valsartan  1 tablet Oral BID  . zolpidem  5 mg Oral QHS   Continuous Infusions: . sodium chloride    . sodium chloride 75 mL/hr at 08/27/19 1345  . ciprofloxacin     PRN Meds: acetaminophen **OR** acetaminophen, diphenhydrAMINE **OR** diphenhydrAMINE, methocarbamol, metoprolol tartrate, morphine injection, ondansetron **OR** ondansetron (ZOFRAN) IV, oxyCODONE, polyethylene glycol, simethicone  Allergies:    Allergies  Allergen Reactions  . Penicillins Nausea And Vomiting    Severe vomiting! DID THE REACTION INVOLVE: Swelling of the face/tongue/throat, SOB, or low BP? No Sudden or severe rash/hives, skin peeling, or the inside of the mouth or nose? No Did it require medical treatment? No When did it last happen?childhood allergy If all above answers are "NO", may proceed with cephalosporin use.     Social History:   Social History   Socioeconomic History  . Marital status: Single    Spouse name: Not on file  . Number of children: Not on file  . Years of education: Not on file  . Highest education level: Not on file  Occupational History  . Not on file  Tobacco Use  . Smoking status: Former Smoker    Types: Cigarettes    Quit date: 04/29/2013    Years since quitting: 6.3  . Smokeless tobacco: Never Used  Vaping Use  . Vaping Use: Never used  Substance and Sexual Activity  . Alcohol use: Yes    Alcohol/week: 5.0 standard drinks    Types: 3 Glasses of wine, 2 Shots of liquor per week  . Drug use: No  . Sexual activity: Not on file  Other Topics Concern  . Not on  file  Social History Narrative  . Not on file   Social Determinants of Health   Financial Resource Strain:   . Difficulty of Paying Living Expenses:   Food Insecurity:   . Worried About Charity fundraiser in the Last Year:   . Arboriculturist in the Last Year:   Transportation Needs:   . Film/video editor (Medical):   Marland Kitchen Lack of Transportation (Non-Medical):   Physical Activity:   . Days of Exercise per Week:   . Minutes of Exercise per Session:   Stress:   . Feeling of Stress :   Social Connections:   . Frequency of Communication with Friends and Family:   . Frequency of Social Gatherings with Friends and Family:   . Attends Religious Services:   . Active Member of Clubs or Organizations:   .  Attends Archivist Meetings:   Marland Kitchen Marital Status:   Intimate Partner Violence:   . Fear of Current or Ex-Partner:   . Emotionally Abused:   Marland Kitchen Physically Abused:   . Sexually Abused:     Family History:   Family History  Problem Relation Age of Onset  . Heart disease Mother   . Prostate cancer Father   . Heart disease Father    Family Status:  Family Status  Relation Name Status  . Mother  Deceased  . Father  Deceased  . Sister  Alive  . Sister  Alive   ROS:  Please see the history of present illness.  All other ROS reviewed and negative.     Physical Exam/Data:   Vitals:   08/27/19 0954 08/27/19 1002 08/27/19 1230 08/27/19 1330  BP: 127/78  (!) 133/75 126/65  Pulse: 66  65 65  Resp: 15  18 18   Temp: (!) 97.4 F (36.3 C)     TempSrc: Oral     SpO2: 98%  96% 94%  Weight:  87.5 kg    Height:  5\' 10"  (1.778 m)     No intake or output data in the 24 hours ending 08/27/19 1539 Filed Weights   08/27/19 1002  Weight: 87.5 kg   Body mass index is 27.69 kg/m.   General: Well developed, well nourished, NAD Lungs:Clear to ausculation bilaterally. No wheezes, rales, or rhonchi. Breathing is unlabored. Cardiovascular: RRR with S1 S2. No  murmurs Abdomen: Soft, non-tender, non-distended. No obvious abdominal masses. Extremities: No edema. Radial pulses 2+ bilaterally Neuro: Alert and oriented. No focal deficits. No facial asymmetry. MAE spontaneously. Psych: Responds to questions appropriately with normal affect.     EKG:  The EKG was personally reviewed and demonstrates:  08/28/19 NSR with known LBBB and no ST changes  Telemetry:  Telemetry was personally reviewed and demonstrates: No currently on telemetry   Relevant CV Studies:  Echocardiogram 01/07/2019:  1. Left ventricular ejection fraction, by visual estimation, is 40%. The  left ventricle has moderately decreased function. There is mildly  increased left ventricular hypertrophy.  2. Abnormal septal motion consistent with left bundle branch block.  3. Left ventricular diastolic parameters are consistent with Grade I  diastolic dysfunction (impaired relaxation).  4. Global right ventricle has normal systolic function.The right  ventricular size is normal. No increase in right ventricular wall  thickness.  5. Left atrial size was normal.  6. Right atrial size was normal.  7. The mitral valve is normal in structure. Trivial mitral valve  regurgitation. No evidence of mitral stenosis.  8. The tricuspid valve is normal in structure. Tricuspid valve  regurgitation is trivial.  9. The aortic valve is normal in structure. Aortic valve regurgitation is  not visualized. No evidence of aortic valve sclerosis or stenosis.  10. The pulmonic valve was normal in structure. Pulmonic valve  regurgitation is trivial.  11. Aortic dilatation noted.  12. There is mild dilatation of the aortic root measuring 39 mm.  13. TR signal is inadequate for assessing pulmonary artery systolic  pressure.  14. The inferior vena cava is normal in size with greater than 50%  respiratory variability, suggesting right atrial pressure of 3 mmHg.   LHC 01/23/2018:   Prox RCA lesion  is 20% stenosed.  Dist RCA lesion is 20% stenosed.  Ost 2nd Mrg lesion is 20% stenosed.  Prox Cx to Mid Cx lesion is 20% stenosed.  Prox LAD to Mid LAD lesion  is 20% stenosed.   1. Mild non-obstructive CAD  Recommendations: Medical management of CAD  Stress test 01/15/2018:   Nuclear stress EF: 37%. Generalized hypokinesis with mild dyskinesis  There was no ST segment deviation noted during stress.  Defect 1: There is a medium defect of moderate severity present in the mid anteroseptal, apical anterior and apex location. This defect is fixed. No ischemia. This may be related to underlying left bundle branch block.  Overall moderate risk nuclear stress test based mainly upon reduced ejection fraction. Distal anteroseptal fixed defect may be representative of underlying left bundle branch block  Laboratory Data:  Chemistry Recent Labs  Lab 08/27/19 1200  NA 139  K 4.4  CL 100  CO2 28  GLUCOSE 118*  BUN 23  CREATININE 1.05  CALCIUM 9.6  GFRNONAA >60  GFRAA >60  ANIONGAP 11    Total Protein  Date Value Ref Range Status  08/27/2019 7.9 6.5 - 8.1 g/dL Final   Albumin  Date Value Ref Range Status  08/27/2019 4.5 3.5 - 5.0 g/dL Final   AST  Date Value Ref Range Status  08/27/2019 54 (H) 15 - 41 U/L Final   ALT  Date Value Ref Range Status  08/27/2019 99 (H) 0 - 44 U/L Final   Alkaline Phosphatase  Date Value Ref Range Status  08/27/2019 105 38 - 126 U/L Final   Total Bilirubin  Date Value Ref Range Status  08/27/2019 1.1 0.3 - 1.2 mg/dL Final   Hematology Recent Labs  Lab 08/27/19 1200  WBC 9.6  RBC 4.97  HGB 16.2  HCT 49.6  MCV 99.8  MCH 32.6  MCHC 32.7  RDW 13.2  PLT 233   Cardiac EnzymesNo results for input(s): TROPONINI in the last 168 hours. No results for input(s): TROPIPOC in the last 168 hours.  BNPNo results for input(s): BNP, PROBNP in the last 168 hours.  DDimer No results for input(s): DDIMER in the last 168 hours. TSH: No  results found for: TSH Lipids:No results found for: CHOL, HDL, LDLCALC, LDLDIRECT, TRIG, CHOLHDL HgbA1c:No results found for: HGBA1C  Radiology/Studies:  No results found.  Assessment and Plan:   1. Pre-operative cardiac clearance: -Pt presented to [facility] on 08/27/19 with a 2 day hx of epigastric and RUQ abdominal pain. He was seen by his PCP via tele-health visit and was scheduled for an OP CT at [facility] which showed multiple gallstones in the gallbladder with gallbladder wall thickening with surrounding inflammatory change, compatible with acute cholecystitis. Given this, he was contacted and sent to the ED.   -On ED presentation, WBC was 9.6 with elevated lipase at 87.  -AST/ALT found to be 54/99 -Echocardiogram from 12/2017 with LVEF of 40% with septal-lateral  dyssynchrony with anteroseptal and inferoseptal severe hypokinesis and G1DD with no valvular disease. Subsequent stress test 01/23/18 with medium fixed defect of moderate severity present in the mid anteroseptal, apical anterior and apex location -LHC from 01/23/18 with no CAD and normal filling pressures with a PMWP at 33mmHg.  -The patient does not have any unstable cardiac conditions. Upon evaluation today, he can achieve 4 METs or greater without anginal symptoms.Pt reports he is relatively active at baseline, walking with his wife several 5-8 miles per week. He has no complaints of LE edema, orthopnea or other s/s of fluid volume overload. As above, he has undergo extensive cardiac workup with stress testing and subsequent LHC which showed mild, non-obstructive CAD. According to [facility] and AHA guidelines, he requires no  further cardiac workup prior to his noncardiac surgery and should be at acceptable risk. His revised cardiac risk of peri-procedural MI or cardiac arrest following surgery is low.  -Would have anesthesia be cautious with peri/post operative IVF given know non-ischemic CM -Our service is available as necessary in the  perioperative period.  2. Mild non-obstructive disease per [facility] 12/2017: -Per LHC performed 01/23/18 with mild non-obstructive disease  -Denies anginal symptoms  -Continue ASA, statin, BB, Entresto   3. Non-ischemic cardiomyopathy: -Most recent chocardiogram with an LVEF at 40% with septal motion consistent with LBBB with impaired diastolic dysfunction, L9FX -Continue Entresto 24-26>>recommend up titrating to 49/51 dosing>>ordered  -BPs stable at 142/70>137/60>134/70 -Creatinine, 0.89 with a baseline of 0.9-1.0 -Appears euvolemic on exam   3. HTN: -Stable,142/70>137/60>134/70 -Continue carvedilol, Entresto  -Will up-titrate to 49/51 dosing   4. HLD: -Continue Crestor    For questions or updates, please contact [facility] Please consult [email or web address] for contact info under Cardiology/STEMI.   Lyndel Safe NP-C [facility] Pager: [phone] 08/27/2019 3:39 PM   Attending Note:   The patient was seen and examined.  Agree with assessment and plan as noted above.  Changes made to the above note as needed.  Patient seen and independently examined with  Kathyrn Drown, NP .   We discussed all aspects of the encounter. I agree with the assessment and plan as stated above.  1.   Chronic combined S/D CHF:   Pt did well after his surgery .   No complications   He will follow up with Dr. Johnsie Cancel .  2. LBBB :  Stable   3 coronary calcium score:  No angina . Follow up wht Dr. Johnsie Cancel     I have spent a total of 40 minutes with patient reviewing hospital  notes , telemetry, EKGs, labs and examining patient as well as establishing an assessment and plan that was discussed with the patient. > 50% of time was spent in direct patient care.    Thayer Headings, Brooke Bonito., MD, [facility] 08/28/2019, 5:22 PM [address]  [place] Pager [phone]

## 2019-08-27 NOTE — ED Notes (Signed)
Transport called.

## 2019-08-27 NOTE — H&P (Signed)
Frank Foster 1945/02/21  [id number].    Requesting MD: Dr. Tomi Bamberger Chief Complaint/Reason for Consult: Acute Cholecystitis   HPI: Frank Foster is a 74 y.o. male with a history of HTN, HLD, CAD/DCM (followed by Dr. Johnsie Cancel), former smoker (quit in 2015) who presented to [facility] for abdominal pain.   Patient reports that his pain began 2 days ago and is located in the epigastrium and RUQ with radiation into his shoulder. His pain is constant, and worsened with movement and palpation. His pain is worse with eating. He reports associated decreased appetite, and n/v. States that he did have 1 similar episode about 6 weeks ago, but that time the pain resolved without intervention. This time he tried Tylenol but it did not help.  Patient was seen via televisit by his PCP and had an outpatient CT scan at [facility] yesterday that showed multiple gallstones in the gallbladder with gallbladder wall thickening with surrounding inflammatory change, compatible with acute cholecystitis. The common bile duct was dilated at 12 mm. There was no visible stone on this examination. He was sent to the ED for evaluation. In the ED he was noted to be afebrile, without tachycardia, tachypnea, hypoxia or hypotension. WBC 9.6. Lipase elevated at 87. AST 54, ALT 99, Alk phos 105. T. Bili wnl at 1.1. General Surgery was asked to see.   No prior abdominal surgeries  He is not on any blood thinners Employment: Music therapist  Married (wife: Shae Augello) Former Smoker (quit in 2015) Drinks ~5 alcoholic beverages per week No ilicit drug use  ROS: Review of Systems  Constitutional: Negative for chills and fever.  Respiratory: Negative for cough and shortness of breath.   Cardiovascular: Negative for chest pain and leg swelling.  Gastrointestinal: Positive for abdominal pain, nausea and vomiting. Negative for diarrhea.  Genitourinary: Negative for dysuria.  Musculoskeletal: Positive for back pain. Negative for  joint pain.  Skin: Negative for rash.  Psychiatric/Behavioral: Negative for substance abuse.  All other systems reviewed and are negative.   Family History  Problem Relation Age of Onset  . Heart disease Mother   . Prostate cancer Father   . Heart disease Father     Past Medical History:  Diagnosis Date  . Body mass index (bmi) 28.0-28.9, adult   . Gout   . Hyperlipidemia   . Hypertension     Past Surgical History:  Procedure Laterality Date  . broken nose    . KNEE SURGERY     arthroscopic right knee  . RIGHT/LEFT HEART CATH AND CORONARY ANGIOGRAPHY N/A 01/23/2018   Procedure: RIGHT/LEFT HEART CATH AND CORONARY ANGIOGRAPHY;  Surgeon: Burnell Blanks, MD;  Location: [place] [facility];  Service: Cardiovascular;  Laterality: N/A;    Social History:  reports that he quit smoking about 6 years ago. His smoking use included cigarettes. He has never used smokeless tobacco. He reports current alcohol use of about 5.0 standard drinks of alcohol per week. He reports that he does not use drugs.  Allergies:  Allergies  Allergen Reactions  . Penicillins Nausea And Vomiting    Severe vomiting! DID THE REACTION INVOLVE: Swelling of the face/tongue/throat, SOB, or low BP? No Sudden or severe rash/hives, skin peeling, or the inside of the mouth or nose? No Did it require medical treatment? No When did it last happen?childhood allergy If all above answers are "NO", may proceed with cephalosporin use.     (Not in a hospital admission)  Physical Exam: Blood pressure (!) 133/75, pulse 65, temperature (!) 97.4 F (36.3 C), temperature source Oral, resp. rate 18, height 5' 10"  (1.778 m), weight 87.5 kg, SpO2 96 %. General: pleasant, WD/WN white male who is laying in bed in NAD HEENT: head is normocephalic, atraumatic.  Sclera are noninjected.  PERRL.  Ears and nose without any masses or lesions.  Mouth is pink and moist. Dentition fair Heart: regular, rate, and  rhythm.  Normal s1,s2. No obvious murmurs, gallops, or rubs noted.  Palpable pedal pulses bilaterally  Lungs: CTAB, no wheezes, rhonchi, or rales noted.  Respiratory effort nonlabored Abd: Soft, protuberant, tenderness of the epigastrium and RUQ without rebound, rigidity or guarding. +BS, no masses, hernias, or organomegaly MS: no BUE/BLE edema, calves soft and nontender Skin: warm and dry with no masses, lesions, or rashes Psych: A&Ox4 with an appropriate affect Neuro: cranial nerves grossly intact, equal strength in BUE/BLE bilaterally, normal speech, though process intact  Results for orders placed or performed during the hospital encounter of 08/27/19 (from the past 48 hour(s))  Lipase, blood     Status: Abnormal   Collection Time: 08/27/19 12:00 PM  Result Value Ref Range   Lipase 87 (H) 11 - 51 U/L    Comment: Performed at [facility], [place] [address], [place], [place] [address]  Comprehensive metabolic panel     Status: Abnormal   Collection Time: 08/27/19 12:00 PM  Result Value Ref Range   Sodium 139 135 - 145 mmol/L   Potassium 4.4 3.5 - 5.1 mmol/L   Chloride 100 98 - 111 mmol/L   CO2 28 22 - 32 mmol/L   Glucose, Bld 118 (H) 70 - 99 mg/dL    Comment: Glucose reference range applies only to samples taken after fasting for at least 8 hours.   BUN 23 8 - 23 mg/dL   Creatinine, Ser 1.05 0.61 - 1.24 mg/dL   Calcium 9.6 8.9 - 10.3 mg/dL   Total Protein 7.9 6.5 - 8.1 g/dL   Albumin 4.5 3.5 - 5.0 g/dL   AST 54 (H) 15 - 41 U/L   ALT 99 (H) 0 - 44 U/L   Alkaline Phosphatase 105 38 - 126 U/L   Total Bilirubin 1.1 0.3 - 1.2 mg/dL   GFR calc non Af Amer >60 >60 mL/min   GFR calc Af Amer >60 >60 mL/min   Anion gap 11 5 - 15    Comment: Performed at [facility] [place] [address], [place], [place] [address]  CBC     Status: None   Collection Time: 08/27/19 12:00 PM  Result Value Ref Range   WBC 9.6 4.0 - 10.5 K/uL   RBC 4.97 4.22 - 5.81 MIL/uL    Hemoglobin 16.2 13.0 - 17.0 g/dL   HCT 49.6 39 - 52 %   MCV 99.8 80.0 - 100.0 fL   MCH 32.6 26.0 - 34.0 pg   MCHC 32.7 30.0 - 36.0 g/dL   RDW 13.2 11.5 - 15.5 %   Platelets 233 150 - 400 K/uL   nRBC 0.0 0.0 - 0.2 %    Comment: Performed at [facility], [place] [address] [place], [place] [address]  Urinalysis, Routine w reflex microscopic     Status: Abnormal   Collection Time: 08/27/19 12:03 PM  Result Value Ref Range   Color, Urine YELLOW YELLOW   APPearance CLEAR CLEAR   Specific Gravity, Urine 1.028 1.005 - 1.030   pH 5.0  5.0 - 8.0   Glucose, UA NEGATIVE NEGATIVE mg/dL   Hgb urine dipstick NEGATIVE NEGATIVE   Bilirubin Urine NEGATIVE NEGATIVE   Ketones, ur 20 (A) NEGATIVE mg/dL   Protein, ur NEGATIVE NEGATIVE mg/dL   Nitrite NEGATIVE NEGATIVE   Leukocytes,Ua NEGATIVE NEGATIVE    Comment: Performed at [place] [address] [place], [place] [address]   No results found.  [facility] CT - 08/26/2019 Impression: "Multiple gallstones in the gallbladder. The gallbladder is contracted. The gallbladder wall is thickened with surrounding inflammatory change, compatible with acute cholecystitis. Dilatation of the common bile duct measuring 12 mm, raising suspicion for common duct stone although there is no visible stone on this examination."  Anti-infectives (From admission, onward)   Start     Dose/Rate Route Frequency Ordered Stop   08/27/19 1200  ceFEPIme (MAXIPIME) 2 g in sodium chloride 0.9 % 100 mL IVPB     Discontinue    "And" Linked Group Details   2 g 200 mL/hr over 30 Minutes Intravenous  Once 08/27/19 1158     08/27/19 1200  metroNIDAZOLE (FLAGYL) IVPB 500 mg     Discontinue    "And" Linked Group Details   500 mg 100 mL/hr over 60 Minutes Intravenous  Once 08/27/19 1158          Assessment/Plan HTN HLD CAD/DCM - we have asked cardiology to see for peri-operative risk assessment   Acute Cholecystitis  - Admission to  inpatient - Plan for cardiology to see for cardiac clearance - Agree with IV abx. Continue - Okay for FLD today, NPO at midnight - Will plan for Laparoscopic Cholecystectomy with IOC (dilated CBD on outpatient CT)  - AM labs. Monitor LFT's/T. Bili. Noted dilated CBD on outpatient CT. Currently with normal T. Bili and mildly elevated AST/ALT.  - Mobilize - IS  FEN - FLD, NPO at midnight  VTE - SCDs, Lovenox  ID - Cefepime/Flagyl 7/29 x 1 in ED. Cipro 7/29 >> WBC 9.6 Foley - None Follow-Up - TBD  Margie Billet, [facility] 08/27/2019, 12:56 PM Please see Amion for pager number during day hours 7:00am-4:30pm

## 2019-08-27 NOTE — ED Provider Notes (Signed)
[facility] DEPT Provider Note   CSN: [id number] Arrival date & time: 08/27/19  3557     History Chief Complaint  Patient presents with  . Abdominal Pain    Frank Foster is a 74 y.o. male.  HPI   Patient presents ED for evaluation of abdominal pain.  Patient states he has had upper abdominal pain for the last couple of days.  It has been radiating to his shoulder.  He had some nausea and vomiting the other day.  He has had decreased p.o. intake and decreased appetite.  Patient denies any fevers.  He went to his primary care doctor the other day.  He had an evaluation that included laboratory tests and an outpatient CT scan.  Patient was told he had gallstones and findings concerning for cholecystitis.  They attempted to have him see a Psychologist, sport and exercise.  Patient was instructed to come to the ED for evaluation.  Medical records show point-of-care creatinine of 1.3.  Patient had a CT scan of the abdomen pelvis with IV contrast yesterday at [facility].  It showed multiple gallstones in the gallbladder.  There was gallbladder wall thickening and surrounding inflammatory changes compatible with acute cholecystitis.  Patient also had dilatation of the common bile duct 31mm.  Past Medical History:  Diagnosis Date  . Body mass index (bmi) 28.0-28.9, adult   . Gout   . Hyperlipidemia   . Hypertension     Patient Active Problem List   Diagnosis Date Noted  . Acute cholecystitis 08/27/2019  . Dilated cardiomyopathy [facility])     Past Surgical History:  Procedure Laterality Date  . broken nose    . KNEE SURGERY     arthroscopic right knee  . RIGHT/LEFT HEART CATH AND CORONARY ANGIOGRAPHY N/A 01/23/2018   Procedure: RIGHT/LEFT HEART CATH AND CORONARY ANGIOGRAPHY;  Surgeon: Burnell Blanks, MD;  Location: [facility];  Service: Cardiovascular;  Laterality: N/A;       Family History  Problem Relation Age of Onset  . Heart disease Mother   . Prostate  cancer Father   . Heart disease Father     Social History   Tobacco Use  . Smoking status: Former Smoker    Types: Cigarettes    Quit date: 04/29/2013    Years since quitting: 6.3  . Smokeless tobacco: Never Used  Vaping Use  . Vaping Use: Never used  Substance Use Topics  . Alcohol use: Yes    Alcohol/week: 5.0 standard drinks    Types: 3 Glasses of wine, 2 Shots of liquor per week  . Drug use: No    Home Medications Prior to Admission medications   Medication Sig Start Date End Date Taking? Authorizing Provider  acetaminophen (TYLENOL) 325 MG tablet Take 650 mg by mouth every 6 (six) hours as needed for mild pain or headache.   Yes [provider]  allopurinol (ZYLOPRIM) 100 MG tablet Take 100 mg by mouth every evening.    Yes [provider]  aspirin EC 81 MG tablet Take 81 mg by mouth every evening.   Yes [provider]  carvedilol (COREG) 3.125 MG tablet Take 1 tablet (3.125 mg total) by mouth 2 (two) times daily. 01/15/18  Yes Josue Hector, MD  rosuvastatin (CRESTOR) 5 MG tablet Take 5 mg by mouth every evening.    Yes [provider]  sacubitril-valsartan (ENTRESTO) 24-26 MG Take 1 tablet by mouth 2 (two) times daily.   Yes [provider]  zolpidem (AMBIEN) 5 MG tablet Take 5 mg by mouth at bedtime. 08/14/19  Yes [provider]  sacubitril-valsartan (ENTRESTO) 49-51 MG Take 1 tablet by mouth 2 (two) times daily. Patient not taking: Reported on 08/27/2019 11/13/18   Josue Hector, MD    Allergies    Penicillins  Review of Systems   Review of Systems  All other systems reviewed and are negative.   Physical Exam Updated Vital Signs BP (!) 133/75   Pulse 65   Temp (!) 97.4 F (36.3 C) (Oral)   Resp 18   Ht 1.778 m (5\' 10" )   Wt 87.5 kg   SpO2 96%   BMI 27.69 kg/m   Physical Exam Vitals and nursing note reviewed.  Constitutional:      Appearance: He is well-developed. He is not toxic-appearing or  diaphoretic.  HENT:     Head: Normocephalic and atraumatic.     Right Ear: External ear normal.     Left Ear: External ear normal.  Eyes:     General: No scleral icterus.       Right eye: No discharge.        Left eye: No discharge.     Conjunctiva/sclera: Conjunctivae normal.  Neck:     Trachea: No tracheal deviation.  Cardiovascular:     Rate and Rhythm: Normal rate and regular rhythm.  Pulmonary:     Effort: Pulmonary effort is normal. No respiratory distress.     Breath sounds: Normal breath sounds. No stridor. No wheezing or rales.  Abdominal:     General: Bowel sounds are normal. There is no distension.     Palpations: Abdomen is soft.     Tenderness: There is abdominal tenderness in the right upper quadrant and epigastric area. There is guarding and rebound.     Hernia: No hernia is present.  Musculoskeletal:        General: No tenderness.     Cervical back: Neck supple.  Skin:    General: Skin is warm and dry.     Findings: No rash.  Neurological:     Mental Status: He is alert.     Cranial Nerves: No cranial nerve deficit (no facial droop, extraocular movements intact, no slurred speech).     Sensory: No sensory deficit.     Motor: No abnormal muscle tone or seizure activity.     Coordination: Coordination normal.     ED Results / Procedures / Treatments   Labs (all labs ordered are listed, but only abnormal results are displayed) Labs Reviewed  LIPASE, BLOOD - Abnormal; Notable for the following components:      Result Value   Lipase 87 (*)    All other components within normal limits  COMPREHENSIVE METABOLIC PANEL - Abnormal; Notable for the following components:   Glucose, Bld 118 (*)    AST 54 (*)    ALT 99 (*)    All other components within normal limits  URINALYSIS, ROUTINE W REFLEX MICROSCOPIC - Abnormal; Notable for the following components:   Ketones, ur 20 (*)    All other components within normal limits  SARS CORONAVIRUS 2 BY RT PCR (HOSPITAL  ORDER, [facility])  SARS CORONAVIRUS 2 BY RT PCR (HOSPITAL ORDER, [place] [facility])  CBC    EKG None  Radiology No results found.  Procedures Procedures (including critical care time)  Medications Ordered in ED Medications  0.9 %  sodium chloride  infusion ( Intravenous New Bag/Given 08/27/19 1209)  ceFEPIme (MAXIPIME) 2 g in sodium chloride 0.9 % 100 mL IVPB (2 g Intravenous New Bag/Given 08/27/19 1231)    And  metroNIDAZOLE (FLAGYL) IVPB 500 mg (has no administration in time range)  enoxaparin (LOVENOX) injection 40 mg (has no administration in time range)  0.9 %  sodium chloride infusion (has no administration in time range)  ciprofloxacin (CIPRO) IVPB 400 mg (has no administration in time range)  acetaminophen (TYLENOL) tablet 650 mg (has no administration in time range)    Or  acetaminophen (TYLENOL) suppository 650 mg (has no administration in time range)  oxyCODONE-acetaminophen (PERCOCET/ROXICET) 5-325 MG per tablet 1-2 tablet (has no administration in time range)  morphine 2 MG/ML injection 2 mg (has no administration in time range)  methocarbamol (ROBAXIN) tablet 500 mg (has no administration in time range)  diphenhydrAMINE (BENADRYL) 12.5 MG/5ML elixir 12.5 mg (has no administration in time range)    Or  diphenhydrAMINE (BENADRYL) injection 12.5 mg (has no administration in time range)  docusate sodium (COLACE) capsule 100 mg (has no administration in time range)  polyethylene glycol (MIRALAX / GLYCOLAX) packet 17 g (has no administration in time range)  ondansetron (ZOFRAN-ODT) disintegrating tablet 4 mg (has no administration in time range)    Or  ondansetron (ZOFRAN) injection 4 mg (has no administration in time range)  simethicone (MYLICON) chewable tablet 40 mg (has no administration in time range)  pantoprazole (PROTONIX) injection 40 mg (has no administration in time range)  metoprolol tartrate (LOPRESSOR) injection  5 mg (has no administration in time range)  sodium chloride flush (NS) 0.9 % injection 3 mL (3 mLs Intravenous Given 08/27/19 1204)  morphine 4 MG/ML injection 4 mg (4 mg Intravenous Given 08/27/19 1205)  ondansetron (ZOFRAN) injection 4 mg (4 mg Intravenous Given 08/27/19 1205)    ED Course  I have reviewed the triage vital signs and the nursing notes.  Pertinent labs & imaging results that were available during my care of the patient were reviewed by me and considered in my medical decision making (see chart for details).  Clinical Course as of Aug 26 1332  Thu Aug 27, 2019  1157 CT scan from yesterday is concerning for acute cholecystitis.  Patient is currently hemodynamically stable.  Afebrile.  We will check labs today.  I will consult with general surgery.   [GE]  3662 Discussed case with the nurse in the OR.  Dr. Hassell Done will come see the patient when he is able   [HU]  7654 Patient's laboratory tests do show an elevated lipase.  Metabolic panel does show slight increase in LFTs but no elevation of the bilirubin.  White blood cell count is not severely elevated   [JK]    Clinical Course User Index [JK] Dorie Rank, MD   MDM Rules/Calculators/A&P                           Patient presents to ED for evaluation of possible cholecystitis.  Patient had a CT scan yesterday that did show findings consistent with cholecystitis.  Patient has been having persistent pain.  He does have focal tenderness in the upper abdomen on exam.  Patient's laboratory tests showed slight increase in lipase but no evidence of hyperbilirubinemia.  He is currently afebrile and does not have a leukocytosis.  I have ordered IV antibiotics.  I consulted with the surgical service with my plan on getting the patient  and proceeding with laparoscopic cholecystectomy after cardiology clearance.  Final Clinical Impression(s) / ED Diagnoses Final diagnoses:  Cholecystitis      Dorie Rank, MD 08/27/19 1335

## 2019-08-27 NOTE — ED Notes (Signed)
Coming from Dr Ival Bible office-states CT confirmed gallstones-sending to ED for further workup

## 2019-08-28 ENCOUNTER — Inpatient Hospital Stay (HOSPITAL_COMMUNITY): Payer: PPO | Admitting: Anesthesiology

## 2019-08-28 ENCOUNTER — Encounter (HOSPITAL_COMMUNITY)
Admission: EM | Disposition: A | Discharge status: Home / Self Care | Payer: Self-pay | Source: Home / Self Care | Attending: Emergency Medicine

## 2019-08-28 ENCOUNTER — Inpatient Hospital Stay (HOSPITAL_COMMUNITY): Payer: PPO

## 2019-08-28 ENCOUNTER — Encounter (HOSPITAL_COMMUNITY): Payer: Self-pay

## 2019-08-28 DIAGNOSIS — K915 Postcholecystectomy syndrome: Secondary | ICD-10-CM | POA: Diagnosis not present

## 2019-08-28 DIAGNOSIS — I420 Dilated cardiomyopathy: Secondary | ICD-10-CM | POA: Diagnosis not present

## 2019-08-28 DIAGNOSIS — R1011 Right upper quadrant pain: Secondary | ICD-10-CM | POA: Diagnosis not present

## 2019-08-28 DIAGNOSIS — Z20822 Contact with and (suspected) exposure to covid-19: Secondary | ICD-10-CM | POA: Diagnosis not present

## 2019-08-28 DIAGNOSIS — K8020 Calculus of gallbladder without cholecystitis without obstruction: Secondary | ICD-10-CM | POA: Diagnosis not present

## 2019-08-28 DIAGNOSIS — Z7982 Long term (current) use of aspirin: Secondary | ICD-10-CM | POA: Diagnosis not present

## 2019-08-28 DIAGNOSIS — Z87891 Personal history of nicotine dependence: Secondary | ICD-10-CM | POA: Diagnosis not present

## 2019-08-28 DIAGNOSIS — I2510 Atherosclerotic heart disease of native coronary artery without angina pectoris: Secondary | ICD-10-CM | POA: Diagnosis not present

## 2019-08-28 DIAGNOSIS — K8012 Calculus of gallbladder with acute and chronic cholecystitis without obstruction: Secondary | ICD-10-CM | POA: Diagnosis not present

## 2019-08-28 DIAGNOSIS — I10 Essential (primary) hypertension: Secondary | ICD-10-CM | POA: Diagnosis not present

## 2019-08-28 DIAGNOSIS — K810 Acute cholecystitis: Secondary | ICD-10-CM | POA: Diagnosis not present

## 2019-08-28 DIAGNOSIS — Z79899 Other long term (current) drug therapy: Secondary | ICD-10-CM | POA: Diagnosis not present

## 2019-08-28 DIAGNOSIS — M549 Dorsalgia, unspecified: Secondary | ICD-10-CM | POA: Diagnosis not present

## 2019-08-28 HISTORY — PX: CHOLECYSTECTOMY: SHX55

## 2019-08-28 LAB — CBC
HCT: 42.1 % (ref 39.0–52.0)
Hemoglobin: 13.5 g/dL (ref 13.0–17.0)
MCH: 32.3 pg (ref 26.0–34.0)
MCHC: 32.1 g/dL (ref 30.0–36.0)
MCV: 100.7 fL — ABNORMAL HIGH (ref 80.0–100.0)
Platelets: 182 10*3/uL (ref 150–400)
RBC: 4.18 MIL/uL — ABNORMAL LOW (ref 4.22–5.81)
RDW: 13.2 % (ref 11.5–15.5)
WBC: 8.7 10*3/uL (ref 4.0–10.5)
nRBC: 0 % (ref 0.0–0.2)

## 2019-08-28 LAB — COMPREHENSIVE METABOLIC PANEL
ALT: 62 U/L — ABNORMAL HIGH (ref 0–44)
AST: 27 U/L (ref 15–41)
Albumin: 3.5 g/dL (ref 3.5–5.0)
Alkaline Phosphatase: 82 U/L (ref 38–126)
Anion gap: 6 (ref 5–15)
BUN: 15 mg/dL (ref 8–23)
CO2: 25 mmol/L (ref 22–32)
Calcium: 8.3 mg/dL — ABNORMAL LOW (ref 8.9–10.3)
Chloride: 103 mmol/L (ref 98–111)
Creatinine, Ser: 0.89 mg/dL (ref 0.61–1.24)
GFR calc Af Amer: >60 mL/min (ref >60)
GFR calc non Af Amer: >60 mL/min (ref >60)
Glucose, Bld: 99 mg/dL (ref 70–99)
Potassium: 4 mmol/L (ref 3.5–5.1)
Sodium: 134 mmol/L — ABNORMAL LOW (ref 135–145)
Total Bilirubin: 1.1 mg/dL (ref 0.3–1.2)
Total Protein: 6.5 g/dL (ref 6.5–8.1)

## 2019-08-28 SURGERY — LAPAROSCOPIC CHOLECYSTECTOMY WITH INTRAOPERATIVE CHOLANGIOGRAM
Anesthesia: General | Site: Abdomen

## 2019-08-28 MED ORDER — DEXAMETHASONE SODIUM PHOSPHATE 10 MG/ML IJ SOLN
INTRAMUSCULAR | Status: DC | PRN
  Administered 2019-08-28: 4 mg via INTRAVENOUS

## 2019-08-28 MED ORDER — MIDAZOLAM HCL 2 MG/2ML IJ SOLN
INTRAMUSCULAR | Status: AC
  Filled 2019-08-28: qty 2

## 2019-08-28 MED ORDER — FENTANYL CITRATE (PF) 100 MCG/2ML IJ SOLN
INTRAMUSCULAR | Status: DC | PRN
  Administered 2019-08-28 (×3): 50 ug via INTRAVENOUS
  Administered 2019-08-28: 100 ug via INTRAVENOUS

## 2019-08-28 MED ORDER — FENTANYL CITRATE (PF) 250 MCG/5ML IJ SOLN
INTRAMUSCULAR | Status: AC
  Filled 2019-08-28: qty 5

## 2019-08-28 MED ORDER — PROPOFOL 10 MG/ML IV BOLUS
INTRAVENOUS | Status: AC
  Filled 2019-08-28: qty 20

## 2019-08-28 MED ORDER — PHENYLEPHRINE 40 MCG/ML (10ML) SYRINGE FOR IV PUSH (FOR BLOOD PRESSURE SUPPORT)
PREFILLED_SYRINGE | INTRAVENOUS | Status: AC
  Filled 2019-08-28: qty 10

## 2019-08-28 MED ORDER — SUGAMMADEX SODIUM 200 MG/2ML IV SOLN
INTRAVENOUS | Status: DC | PRN
  Administered 2019-08-28: 200 mg via INTRAVENOUS

## 2019-08-28 MED ORDER — MIDAZOLAM HCL 5 MG/5ML IJ SOLN
INTRAMUSCULAR | Status: DC | PRN
  Administered 2019-08-28: 2 mg via INTRAVENOUS

## 2019-08-28 MED ORDER — ONDANSETRON HCL 4 MG/2ML IJ SOLN
INTRAMUSCULAR | Status: DC | PRN
  Administered 2019-08-28: 4 mg via INTRAVENOUS

## 2019-08-28 MED ORDER — PHENYLEPHRINE 40 MCG/ML (10ML) SYRINGE FOR IV PUSH (FOR BLOOD PRESSURE SUPPORT)
PREFILLED_SYRINGE | INTRAVENOUS | Status: DC | PRN
  Administered 2019-08-28 (×2): 80 ug via INTRAVENOUS

## 2019-08-28 MED ORDER — RINGERS IRRIGATION IR SOLN
Status: DC | PRN
  Administered 2019-08-28: 1

## 2019-08-28 MED ORDER — ROCURONIUM BROMIDE 10 MG/ML (PF) SYRINGE
PREFILLED_SYRINGE | INTRAVENOUS | Status: AC
  Filled 2019-08-28: qty 10

## 2019-08-28 MED ORDER — LIDOCAINE 2% (20 MG/ML) 5 ML SYRINGE
INTRAMUSCULAR | Status: AC
  Filled 2019-08-28: qty 5

## 2019-08-28 MED ORDER — PHENYLEPHRINE HCL-NACL 10-0.9 MG/250ML-% IV SOLN
INTRAVENOUS | Status: DC | PRN
Start: 2019-08-28 — End: 2019-08-28
  Administered 2019-08-28: 35 ug/min via INTRAVENOUS

## 2019-08-28 MED ORDER — BUPIVACAINE LIPOSOME 1.3 % IJ SUSP: INTRAMUSCULAR | Status: DC | PRN

## 2019-08-28 MED ORDER — LACTATED RINGERS IV SOLN: INTRAVENOUS | Status: DC

## 2019-08-28 MED ORDER — ROCURONIUM BROMIDE 100 MG/10ML IV SOLN
INTRAVENOUS | Status: DC | PRN
  Administered 2019-08-28: 50 mg via INTRAVENOUS
  Administered 2019-08-28: 10 mg via INTRAVENOUS

## 2019-08-28 MED ORDER — DEXAMETHASONE SODIUM PHOSPHATE 10 MG/ML IJ SOLN
INTRAMUSCULAR | Status: AC
  Filled 2019-08-28: qty 1

## 2019-08-28 MED ORDER — PROPOFOL 10 MG/ML IV BOLUS
INTRAVENOUS | Status: DC | PRN
  Administered 2019-08-28: 120 mg via INTRAVENOUS

## 2019-08-28 MED ORDER — BUPIVACAINE LIPOSOME 1.3 % IJ SUSP
20.0000 mL | Freq: Once | INTRAMUSCULAR | Status: AC
  Administered 2019-08-28: 20 mL
  Filled 2019-08-28: qty 20

## 2019-08-28 MED ORDER — HYDROCODONE-ACETAMINOPHEN 5-325 MG PO TABS: 1.0000 | ORAL_TABLET | ORAL | Status: DC | PRN

## 2019-08-28 MED ORDER — HYDROCODONE-ACETAMINOPHEN 5-325 MG PO TABS: 1.0000 | ORAL_TABLET | Freq: Four times a day (QID) | ORAL | 0 refills | Status: DC | PRN

## 2019-08-28 MED ORDER — LIDOCAINE HCL (CARDIAC) PF 100 MG/5ML IV SOSY
PREFILLED_SYRINGE | INTRAVENOUS | Status: DC | PRN
  Administered 2019-08-28: 80 mg via INTRAVENOUS

## 2019-08-28 MED ORDER — ONDANSETRON HCL 4 MG/2ML IJ SOLN
INTRAMUSCULAR | Status: AC
  Filled 2019-08-28: qty 2

## 2019-08-28 MED ORDER — SACUBITRIL-VALSARTAN 49-51 MG PO TABS
1.0000 | ORAL_TABLET | Freq: Two times a day (BID) | ORAL | Status: DC
  Filled 2019-08-28 (×2): qty 1

## 2019-08-28 SURGICAL SUPPLY — 41 items
APPLICATOR COTTON TIP 6 STRL (MISCELLANEOUS) ×2 IMPLANT
APPLICATOR COTTON TIP 6IN STRL (MISCELLANEOUS) ×6
APPLIER CLIP 5 13 M/L LIGAMAX5 (MISCELLANEOUS)
APPLIER CLIP ROT 10 11.4 M/L (STAPLE) ×3
BENZOIN TINCTURE PRP APPL 2/3 (GAUZE/BANDAGES/DRESSINGS) IMPLANT
CABLE HIGH FREQUENCY MONO STRZ (ELECTRODE) IMPLANT
CATH REDDICK CHOLANGI 4FR 50CM (CATHETERS) ×3 IMPLANT
CLIP APPLIE 5 13 M/L LIGAMAX5 (MISCELLANEOUS) IMPLANT
CLIP APPLIE ROT 10 11.4 M/L (STAPLE) ×1 IMPLANT
CLOSURE WOUND 1/2 X4 (GAUZE/BANDAGES/DRESSINGS)
COVER MAYO STAND STRL (DRAPES) ×3 IMPLANT
COVER SURGICAL LIGHT HANDLE (MISCELLANEOUS) ×3 IMPLANT
COVER WAND RF STERILE (DRAPES) IMPLANT
DECANTER SPIKE VIAL GLASS SM (MISCELLANEOUS) ×3 IMPLANT
DERMABOND ADVANCED (GAUZE/BANDAGES/DRESSINGS) ×2
DERMABOND ADVANCED .7 DNX12 (GAUZE/BANDAGES/DRESSINGS) ×1 IMPLANT
DRAPE C-ARM 42X120 X-RAY (DRAPES) ×3 IMPLANT
ELECT L-HOOK LAP 45CM DISP (ELECTROSURGICAL)
ELECT PENCIL ROCKER SW 15FT (MISCELLANEOUS) IMPLANT
ELECT REM PT RETURN 15FT ADLT (MISCELLANEOUS) ×3 IMPLANT
ELECTRODE L-HOOK LAP 45CM DISP (ELECTROSURGICAL) IMPLANT
GLOVE BIOGEL M 8.0 STRL (GLOVE) ×3 IMPLANT
GOWN STRL REUS W/TWL XL LVL3 (GOWN DISPOSABLE) ×3 IMPLANT
HEMOSTAT SURGICEL 4X8 (HEMOSTASIS) IMPLANT
IV CATH 14GX2 1/4 (CATHETERS) ×3 IMPLANT
KIT BASIN OR (CUSTOM PROCEDURE TRAY) ×3 IMPLANT
KIT TURNOVER KIT A (KITS) IMPLANT
POUCH RETRIEVAL ECOSAC 10 (ENDOMECHANICALS) IMPLANT
POUCH RETRIEVAL ECOSAC 10MM (ENDOMECHANICALS)
SCISSORS LAP 5X45 EPIX DISP (ENDOMECHANICALS) ×3 IMPLANT
SET IRRIG TUBING LAPAROSCOPIC (IRRIGATION / IRRIGATOR) ×3 IMPLANT
SET TUBE SMOKE EVAC HIGH FLOW (TUBING) ×3 IMPLANT
SLEEVE XCEL OPT CAN 5 100 (ENDOMECHANICALS) ×6 IMPLANT
STRIP CLOSURE SKIN 1/2X4 (GAUZE/BANDAGES/DRESSINGS) IMPLANT
SUT MNCRL AB 4-0 PS2 18 (SUTURE) ×6 IMPLANT
SYR 20ML LL LF (SYRINGE) ×3 IMPLANT
TOWEL OR 17X26 10 PK STRL BLUE (TOWEL DISPOSABLE) ×3 IMPLANT
TRAY LAPAROSCOPIC (CUSTOM PROCEDURE TRAY) ×3 IMPLANT
TROCAR BLADELESS OPT 5 100 (ENDOMECHANICALS) ×3 IMPLANT
TROCAR XCEL BLUNT TIP 100MML (ENDOMECHANICALS) IMPLANT
TROCAR XCEL NON-BLD 11X100MML (ENDOMECHANICALS) ×3 IMPLANT

## 2019-08-28 NOTE — Anesthesia Preprocedure Evaluation (Addendum)
Anesthesia Evaluation  Patient identified by MRN, date of birth, ID band Patient awake    Reviewed: Allergy & Precautions, NPO status , Patient's Chart, lab work & pertinent test results  Airway Mallampati: II  TM Distance: >3 FB Neck ROM: Full    Dental no notable dental hx.    Pulmonary neg pulmonary ROS, former smoker,    Pulmonary exam normal breath sounds clear to auscultation       Cardiovascular hypertension, Pt. on medications + CAD  Normal cardiovascular exam Rhythm:Regular Rate:Normal  Echo 12/20 1. Left ventricular ejection fraction, by visual estimation, is 40%. The  left ventricle has moderately decreased function. There is mildly  increased left ventricular hypertrophy.  2. Abnormal septal motion consistent with left bundle branch block.  3. Left ventricular diastolic parameters are consistent with Grade I  diastolic dysfunction (impaired relaxation).  4. Global right ventricle has normal systolic function.The right  ventricular size is normal. No increase in right ventricular wall  thickness.   CATH 12/19 Mild non-obstructive CAD   Neuro/Psych    GI/Hepatic   Endo/Other    Renal/GU   negative genitourinary   Musculoskeletal   Abdominal   Peds  Hematology   Anesthesia Other Findings   Reproductive/Obstetrics                            Anesthesia Physical Anesthesia Plan  ASA: III  Anesthesia Plan: General   Post-op Pain Management:    Induction: Intravenous  PONV Risk Score and Plan: 2 and Ondansetron and Dexamethasone  Airway Management Planned: Oral ETT  Additional Equipment:   Intra-op Plan:   Post-operative Plan: Extubation in OR  Informed Consent:     Dental advisory given  Plan Discussed with: Anesthesiologist and CRNA  Anesthesia Plan Comments: (  )       Anesthesia Quick Evaluation

## 2019-08-28 NOTE — Care Management Obs Status (Signed)
[facility] NOTIFICATION   Patient Details  Name: Frank Foster MRN: [id number] Date of Birth: 16-Oct-1945   Medicare Observation Status Notification Given:  [name], [place] 08/28/2019, 4:49 PM

## 2019-08-28 NOTE — Transfer of Care (Signed)
Immediate Anesthesia Transfer of Care Note  Patient: Frank Foster  Procedure(s) Performed: LAPAROSCOPIC CHOLECYSTECTOMY WITH INTRAOPERATIVE CHOLANGIOGRAM (N/A Abdomen)  Patient Location: PACU  Anesthesia Type:General  Level of Consciousness: awake, alert , oriented and patient cooperative  Airway & Oxygen Therapy: Patient Spontanous Breathing and Patient connected to face mask oxygen  Post-op Assessment: Report given to RN and Post -op Vital signs reviewed and stable  Post vital signs: Reviewed and stable  Last Vitals:  Vitals Value Taken Time  BP 150/58 08/28/19 1353  Temp    Pulse 80 08/28/19 1356  Resp 17 08/28/19 1356  SpO2 97 % 08/28/19 1356  Vitals shown include unvalidated device data.  Last Pain:  Vitals:   08/28/19 1353  TempSrc:   PainSc: (P) 0-No pain      Patients Stated Pain Goal: 4 (02/54/27 0623)  Complications: No complications documented.

## 2019-08-28 NOTE — Care Management CC44 (Signed)
Condition Code 44 Documentation Completed  Patient Details  Name: ANJEL PARDO MRN: [id number] Date of Birth: 03/27/1945   Condition Code 44 given:  Yes Patient signature on Condition Code 44 notice:  Yes Documentation of 2 MD's agreement:  Yes Code 44 added to claim:  Yes    Michaelanthony Kempton, LCSW 08/28/2019, 4:49 PM

## 2019-08-28 NOTE — Anesthesia Postprocedure Evaluation (Signed)
Anesthesia Post Note  Patient: Frank Foster  Procedure(s) Performed: LAPAROSCOPIC CHOLECYSTECTOMY WITH INTRAOPERATIVE CHOLANGIOGRAM (N/A Abdomen)     Patient location during evaluation: PACU Anesthesia Type: General Level of consciousness: awake and alert and oriented Pain management: pain level controlled Vital Signs Assessment: post-procedure vital signs reviewed and stable Respiratory status: spontaneous breathing, nonlabored ventilation and respiratory function stable Cardiovascular status: blood pressure returned to baseline Postop Assessment: no apparent nausea or vomiting Anesthetic complications: no   No complications documented.  Last Vitals:  Vitals:   08/28/19 1353 08/28/19 1400  BP: (!) 150/58 (!) 147/58  Pulse:  79  Resp: 16 13  Temp: 36.5 C   SpO2: 97% 98%    Last Pain:  Vitals:   08/28/19 1353  TempSrc:   PainSc: 0-No pain                 Brennan Bailey

## 2019-08-28 NOTE — Interval H&P Note (Signed)
History and Physical Interval Note:  08/28/2019 11:31 AM  Frank Foster  has presented today for surgery, with the diagnosis of ACUTE CHOLECYSTITIS.  The various methods of treatment have been discussed with the patient and family. After consideration of risks, benefits and other options for treatment, the patient has consented to  Procedure(s): LAPAROSCOPIC CHOLECYSTECTOMY WITH INTRAOPERATIVE CHOLANGIOGRAM (N/A) as a surgical intervention.  The patient's history has been reviewed, patient examined, no change in status, stable for surgery.  I have reviewed the patient's chart and labs.  Questions were answered to the patient's satisfaction.     Pedro Earls

## 2019-08-28 NOTE — Op Note (Signed)
Frank Foster  Primary Care Physician:  Fanny Bien, MD    08/28/2019  2:10 PM  Procedure: Laparoscopic Cholecystectomy with intraoperative cholangiogram  Surgeon: Catalina Antigua B. Hassell Done, MD, FACS Asst:  none  Anes:  General  Drains:  None  Findings: Cholecystitis with impacted stones in cystic duct  Description of Procedure: The patient was taken to OR 4 and given general anesthesia.  The patient was prepped with chlorohexidine prep and draped sterilely. A time out was performed including identifying the patient and discussing their procedure.  Access to the abdomen was achieved with a 5 mm Optiview through the right upper quadrant.Marland Kitchen  Port placement included 3 5 mm and 111 in the upper abdomen which subsequently closed with a single 0 Vicryl in the fascia..    The gallbladder was visualized and appeared chronically inflamed.  There was omentum and fat stuck up on the gallbladder about halfway up the fundus and involving the infundibulum..   The fundus of the gallbaldder was grasped and the gallbladder was elevated. Traction on the infundibulum allowed for successful demonstration of the critical view. Inflammatory changes were chronic..  The cystic duct was identified and clipped up on the gallbladder and an incision was made in the cystic duct and the Reddick catheter was inserted after milking the cystic duct of any debris. A dynamic cholangiogram was performed which demonstrated intrahepatic filling including common and proper hepatic ducts and then free flow into the duodenum.  I did milk out 3 anthracotic pigment stones from the cystic duct..    The cystic duct was then triple clipped and divided, the cystic artery was double clipped and divided and then the gallbladder was removed from the gallbladder bed. Removal of the gallbladder from the gallbladder bed was performed without entering it or spilling stones..  The gallbladder was then placed in a bag and brought out through one of  the trocar sites. The gallbladder bed was inspected and no bleeding or bile leaks were seen.   Laparoscopic visualization was used when closing fascial defects for trocar sites.   Incisions were injected with Exparel and closed with 4-0 Monocryl and Dermabond on the skin.  Sponge and needle count were correct.    The patient was taken to the recovery room in satisfactory condition.

## 2019-08-28 NOTE — Anesthesia Procedure Notes (Signed)
Procedure Name: Intubation Date/Time: 08/28/2019 12:00 PM Performed by: Maxwell Caul, CRNA Pre-anesthesia Checklist: Patient identified, Emergency Drugs available, Suction available and Patient being monitored Patient Re-evaluated:Patient Re-evaluated prior to induction Oxygen Delivery Method: Circle system utilized Preoxygenation: Pre-oxygenation with 100% oxygen Induction Type: IV induction Ventilation: Mask ventilation without difficulty Laryngoscope Size: Mac and 4 Grade View: Grade I Tube type: Oral Tube size: 7.5 mm Number of attempts: 1 Airway Equipment and Method: Stylet and Oral airway Placement Confirmation: ETT inserted through vocal cords under direct vision,  positive ETCO2 and breath sounds checked- equal and bilateral Secured at: 21 cm Tube secured with: Tape Dental Injury: Teeth and Oropharynx as per pre-operative assessment

## 2019-08-28 NOTE — Discharge Instructions (Signed)
[facility] ° °Please arrive at least 30 min before your appointment to complete your check in paperwork.  If you are unable to arrive 30 min prior to your appointment time we may have to cancel or reschedule you. °LAPAROSCOPIC SURGERY: POST OP INSTRUCTIONS °Always review your discharge instruction sheet given to you by the facility where your surgery was performed. °IF YOU HAVE DISABILITY OR FAMILY LEAVE FORMS, YOU MUST BRING THEM TO THE OFFICE FOR PROCESSING.   °DO NOT GIVE THEM TO YOUR DOCTOR. ° °PAIN CONTROL ° °1. First take acetaminophen (Tylenol) AND/or ibuprofen (Advil) to control your pain after surgery.  Follow directions on package.  Taking acetaminophen (Tylenol) and/or ibuprofen (Advil) regularly after surgery will help to control your pain and lower the amount of prescription pain medication you may need.  You should not take more than 4,000 mg (4 grams) of acetaminophen (Tylenol) in 24 hours.  You should not take ibuprofen (Advil), aleve, motrin, naprosyn or other NSAIDS if you have a history of stomach ulcers or chronic kidney disease.  °2. A prescription for pain medication may be given to you upon discharge.  Take your pain medication as prescribed, if you still have uncontrolled pain after taking acetaminophen (Tylenol) or ibuprofen (Advil). °3. Use ice packs to help control pain. °4. If you need a refill on your pain medication, please contact your pharmacy.  They will contact our office to request authorization. Prescriptions will not be filled after 5pm or on week-ends. ° °HOME MEDICATIONS °5. Take your usually prescribed medications unless otherwise directed. ° °DIET °6. You should follow a light diet the first few days after arrival home.  Be sure to include lots of fluids daily. Avoid fatty, fried foods.  ° °CONSTIPATION °7. It is common to experience some constipation after surgery and if you are taking pain medication.  Increasing fluid intake and taking a stool  softener (such as Colace) will usually help or prevent this problem from occurring.  A mild laxative (Milk of Magnesia or Miralax) should be taken according to package instructions if there are no bowel movements after 48 hours. ° °WOUND/INCISION CARE °8. Most patients will experience some swelling and bruising in the area of the incisions.  Ice packs will help.  Swelling and bruising can take several days to resolve.  °9. Unless discharge instructions indicate otherwise, follow guidelines below  °a. STERI-STRIPS - you may remove your outer bandages 48 hours after surgery, and you may shower at that time.  You have steri-strips (small skin tapes) in place directly over the incision.  These strips should be left on the skin for 7-10 days.   °b. DERMABOND/SKIN GLUE - you may shower in 24 hours.  The glue will flake off over the next 2-3 weeks. °10. Any sutures or staples will be removed at the office during your follow-up visit. ° °ACTIVITIES °11. You may resume regular (light) daily activities beginning the next day--such as daily self-care, walking, climbing stairs--gradually increasing activities as tolerated.  You may have sexual intercourse when it is comfortable.  Refrain from any heavy lifting or straining until approved by your doctor. °a. You may drive when you are no longer taking prescription pain medication, you can comfortably wear a seatbelt, and you can safely maneuver your car and apply brakes. ° °FOLLOW-UP °12. You should see your doctor in the office for a follow-up appointment approximately 2-3 weeks after your surgery.  You should have been given your post-op/follow-up appointment when   your surgery was scheduled.  If you did not receive a post-op/follow-up appointment, make sure that you call for this appointment within a day or two after you arrive home to insure a convenient appointment time. ° °OTHER INSTRUCTIONS ° °WHEN TO CALL YOUR DOCTOR: °1. Fever over 101.0 °2. Inability to  urinate °3. Continued bleeding from incision. °4. Increased pain, redness, or drainage from the incision. °5. Increasing abdominal pain ° °The clinic staff is available to answer your questions during regular business hours.  Please don’t hesitate to call and ask to speak to one of the nurses for clinical concerns.  If you have a medical emergency, go to the nearest emergency room or call 911.  A surgeon from [facility] is always on call at the hospital. °[address], [address], [place], [place]  [address] ? [address] [address], [place], [place]   [address] °[phone] ? [phone] ? FAX [phone] ° ° ° °

## 2019-08-28 NOTE — Plan of Care (Signed)
Pt ready for DC home 

## 2019-08-29 ENCOUNTER — Encounter (HOSPITAL_COMMUNITY): Payer: Self-pay | Admitting: Surgery

## 2019-08-31 LAB — SURGICAL PATHOLOGY

## 2019-08-31 NOTE — Discharge Summary (Signed)
Physician Discharge Summary  Patient ID: Frank Foster MRN: [id number] DOB/AGE: 03-24-1945 74 y.o.  PCP: Fanny Bien, MD  Admit date: 08/27/2019 Discharge date: 08/28/2019  Admission Diagnoses:  Acute cholecystitis  Discharge Diagnoses:  same  Active Problems:   Acute cholecystitis   Surgery:  Lap chole with IOC  Discharged Condition: improved  Hospital Course:   Admitted with cholecystitis.  Taken the OR the following day where acute cholecystitis was seen and the gallbladder was taken.  He did well and he was ready for disharge on Friday afternoon after his surgery.    Consults: none  Significant Diagnostic Studies: none    Discharge Exam: Blood pressure (!) 153/68, pulse 73, temperature 98.1 F (36.7 C), temperature source Oral, resp. rate 16, height 5\' 10"  (1.778 m), weight 87.5 kg, SpO2 95 %. Incisions are OK  Disposition: Discharge disposition: 01-Home or Self Care       Discharge Instructions    Call MD for:  persistant nausea and vomiting   Complete by: As directed    Call MD for:  redness, tenderness, or signs of infection (pain, swelling, redness, odor or green/yellow discharge around incision site)   Complete by: As directed    Diet - low sodium heart healthy   Complete by: As directed    Increase activity slowly   Complete by: As directed      Allergies as of 08/28/2019      Reactions   Penicillins Nausea And Vomiting   Severe vomiting! DID THE REACTION INVOLVE: Swelling of the face/tongue/throat, SOB, or low BP? No Sudden or severe rash/hives, skin peeling, or the inside of the mouth or nose? No Did it require medical treatment? No When did it last happen?childhood allergy If all above answers are "NO", may proceed with cephalosporin use.      Medication List    TAKE these medications   acetaminophen 325 MG tablet Commonly known as: TYLENOL Take 650 mg by mouth every 6 (six) hours as needed for mild pain or headache.    allopurinol 100 MG tablet Commonly known as: ZYLOPRIM Take 100 mg by mouth every evening.   aspirin EC 81 MG tablet Take 81 mg by mouth every evening.   carvedilol 3.125 MG tablet Commonly known as: COREG Take 1 tablet (3.125 mg total) by mouth 2 (two) times daily.   Entresto 24-26 MG Generic drug: sacubitril-valsartan Take 1 tablet by mouth 2 (two) times daily.   Entresto 49-51 MG Generic drug: sacubitril-valsartan Take 1 tablet by mouth 2 (two) times daily.   HYDROcodone-acetaminophen 5-325 MG tablet Commonly known as: NORCO/VICODIN Take 1 tablet by mouth every 6 (six) hours as needed for moderate pain.   rosuvastatin 5 MG tablet Commonly known as: CRESTOR Take 5 mg by mouth every evening.   zolpidem 5 MG tablet Commonly known as: AMBIEN Take 5 mg by mouth at bedtime.       Follow-up [facility], [place]. Go on 09/17/2019.   Specialty: General Surgery Why: Your appointment is 8/19 at 2:30pm Please arrive 30 minutes prior to your appointment to check in and fill out paperwork. Bring photo ID and insurance information. Contact information: [address] [place] [phone]              Signed: Pedro Earls 08/31/2019, 9:57 PM

## 2019-09-29 MED ORDER — ENTRESTO 49-51 MG PO TABS: 1.0000 | ORAL_TABLET | Freq: Two times a day (BID) | ORAL | 3 refills | Status: DC

## 2019-09-29 NOTE — Telephone Encounter (Signed)
Called patient to make sure which dose he has been taking, since there was 2 doses on his list. Patient state he takes  Entresto 49/51 mg BID. Placed order for patient to pick up at pharamcy of choice.

## 2019-10-15 DIAGNOSIS — Z1339 Encounter for screening examination for other mental health and behavioral disorders: Secondary | ICD-10-CM | POA: Diagnosis not present

## 2019-10-15 DIAGNOSIS — Z0000 Encounter for general adult medical examination without abnormal findings: Secondary | ICD-10-CM | POA: Diagnosis not present

## 2019-10-15 DIAGNOSIS — Z1331 Encounter for screening for depression: Secondary | ICD-10-CM | POA: Diagnosis not present

## 2019-11-04 DIAGNOSIS — Z85828 Personal history of other malignant neoplasm of skin: Secondary | ICD-10-CM | POA: Diagnosis not present

## 2019-11-04 DIAGNOSIS — L814 Other melanin hyperpigmentation: Secondary | ICD-10-CM | POA: Diagnosis not present

## 2019-11-04 DIAGNOSIS — D225 Melanocytic nevi of trunk: Secondary | ICD-10-CM | POA: Diagnosis not present

## 2019-11-04 DIAGNOSIS — D485 Neoplasm of uncertain behavior of skin: Secondary | ICD-10-CM | POA: Diagnosis not present

## 2019-11-04 DIAGNOSIS — L578 Other skin changes due to chronic exposure to nonionizing radiation: Secondary | ICD-10-CM | POA: Diagnosis not present

## 2019-11-04 DIAGNOSIS — D2272 Melanocytic nevi of left lower limb, including hip: Secondary | ICD-10-CM | POA: Diagnosis not present

## 2019-11-04 DIAGNOSIS — D2271 Melanocytic nevi of right lower limb, including hip: Secondary | ICD-10-CM | POA: Diagnosis not present

## 2019-11-04 DIAGNOSIS — L821 Other seborrheic keratosis: Secondary | ICD-10-CM | POA: Diagnosis not present

## 2019-11-04 DIAGNOSIS — Z86018 Personal history of other benign neoplasm: Secondary | ICD-10-CM | POA: Diagnosis not present

## 2019-11-09 DIAGNOSIS — I10 Essential (primary) hypertension: Secondary | ICD-10-CM | POA: Diagnosis not present

## 2019-11-09 DIAGNOSIS — R6889 Other general symptoms and signs: Secondary | ICD-10-CM | POA: Diagnosis not present

## 2019-11-09 DIAGNOSIS — M109 Gout, unspecified: Secondary | ICD-10-CM | POA: Diagnosis not present

## 2019-11-09 DIAGNOSIS — R739 Hyperglycemia, unspecified: Secondary | ICD-10-CM | POA: Diagnosis not present

## 2019-11-09 DIAGNOSIS — E782 Mixed hyperlipidemia: Secondary | ICD-10-CM | POA: Diagnosis not present

## 2019-11-09 DIAGNOSIS — E559 Vitamin D deficiency, unspecified: Secondary | ICD-10-CM | POA: Diagnosis not present

## 2019-11-12 DIAGNOSIS — E559 Vitamin D deficiency, unspecified: Secondary | ICD-10-CM | POA: Diagnosis not present

## 2019-11-12 DIAGNOSIS — M109 Gout, unspecified: Secondary | ICD-10-CM | POA: Diagnosis not present

## 2019-11-12 DIAGNOSIS — R739 Hyperglycemia, unspecified: Secondary | ICD-10-CM | POA: Diagnosis not present

## 2019-11-12 DIAGNOSIS — E1169 Type 2 diabetes mellitus with other specified complication: Secondary | ICD-10-CM | POA: Diagnosis not present

## 2019-11-12 DIAGNOSIS — E782 Mixed hyperlipidemia: Secondary | ICD-10-CM | POA: Diagnosis not present

## 2019-11-12 DIAGNOSIS — G4700 Insomnia, unspecified: Secondary | ICD-10-CM | POA: Diagnosis not present

## 2019-11-12 DIAGNOSIS — I10 Essential (primary) hypertension: Secondary | ICD-10-CM | POA: Diagnosis not present

## 2020-01-20 ENCOUNTER — Ambulatory Visit (INDEPENDENT_AMBULATORY_CARE_PROVIDER_SITE_OTHER)
Admission: RE | Admit: 2020-01-20 | Discharge: 2020-01-20 | Disposition: A | Discharge status: Home / Self Care | Payer: PPO | Source: Ambulatory Visit | Attending: Cardiovascular Disease | Admitting: Cardiovascular Disease

## 2020-01-20 ENCOUNTER — Other Ambulatory Visit: Payer: Self-pay

## 2020-01-20 DIAGNOSIS — Z87891 Personal history of nicotine dependence: Secondary | ICD-10-CM

## 2020-01-20 DIAGNOSIS — Z122 Encounter for screening for malignant neoplasm of respiratory organs: Secondary | ICD-10-CM

## 2020-02-23 NOTE — Progress Notes (Signed)
Cardiology Office Note   Date:  03/01/2020   ID:  TALIS IWAN, DOB 11-14-1945, MRN [id number]  PCP:  Fanny Bien, MD  Cardiologist:   Jenkins Rouge, MD   No chief complaint on file.     History of Present Illness: Frank Foster is a 75 y.o. male who presents for f/u regarding CAD/DCM. He is a former smoker quitting in 2015. History of HTN and HLD. Had screening carotid and calcium score done 10/17/17 Carotids with mild plaque no stenosis Calcium score with mild 3 vessel calcium with score of 205 This was incorrectly labeled as 62 th percentile for age and sex as it is only 46 th percentile for age and sex. He has no cardiac symptoms   He has no chest pain. He is somewhat sedentary Walks 3x/week and golfs a bit. Long discussion with Him about his ECG showing LBBB.   No palpitations, syncope, or exertional Dyspnea  Still working in financial area. Lives in [place] area   TTE done 01/15/18 EF 40%  Myovue with anteroseptal , apical anterior defect fixed EF 36%  Cath 01/23/18 by Dr Julianne Handler showed no CAD and normal filling pressures mean PCWP 8 mmHg  Started on Entresto and coreg On statin for HLD   Their son [name] has done some real estate closings with my brother   Last echo 01/07/19 EF stable 40% no significant valve disease  Had lap chole for acute cholecystitis by Dr Hassell Done 08/28/19   Compliant with meds Functional class one Walks over 10 miles/week   Past Medical History:  Diagnosis Date  . Body mass index (bmi) 28.0-28.9, adult   . Gout   . Hyperlipidemia   . Hypertension     Past Surgical History:  Procedure Laterality Date  . broken nose    . CHOLECYSTECTOMY N/A 08/28/2019   Procedure: LAPAROSCOPIC CHOLECYSTECTOMY WITH INTRAOPERATIVE CHOLANGIOGRAM;  Surgeon: Johnathan Hausen, MD;  Location: [facility];  Service: General;  Laterality: N/A;  . KNEE SURGERY     arthroscopic right knee  . RIGHT/LEFT HEART CATH AND CORONARY ANGIOGRAPHY N/A 01/23/2018    Procedure: RIGHT/LEFT HEART CATH AND CORONARY ANGIOGRAPHY;  Surgeon: Burnell Blanks, MD;  Location: [facility];  Service: Cardiovascular;  Laterality: N/A;     Current Outpatient Medications  Medication Sig Dispense Refill  . acetaminophen (TYLENOL) 325 MG tablet Take 650 mg by mouth every 6 (six) hours as needed for mild pain or headache.    . allopurinol (ZYLOPRIM) 100 MG tablet Take 100 mg by mouth every evening.     Marland Kitchen aspirin EC 81 MG tablet Take 81 mg by mouth every evening.    . carvedilol (COREG) 3.125 MG tablet Take 1 tablet (3.125 mg total) by mouth 2 (two) times daily. 180 tablet 3  . HYDROcodone-acetaminophen (NORCO/VICODIN) 5-325 MG tablet Take 1 tablet by mouth every 6 (six) hours as needed for moderate pain. 15 tablet 0  . rosuvastatin (CRESTOR) 5 MG tablet Take 5 mg by mouth every evening.     . sacubitril-valsartan (ENTRESTO) 49-51 MG Take 1 tablet by mouth 2 (two) times daily. 180 tablet 3  . zolpidem (AMBIEN) 5 MG tablet Take 5 mg by mouth at bedtime.     No current facility-administered medications for this visit.    Allergies:   Penicillins    Social History:  The patient  reports that he quit smoking about 6 years ago. His smoking use included cigarettes. He has never  used smokeless tobacco. He reports current alcohol use of about 5.0 standard drinks of alcohol per week. He reports that he does not use drugs.   Family History:  The patient's family history includes Heart disease in his father and mother; Prostate cancer in his father.    ROS:  Please see the history of present illness.   Otherwise, review of systems are positive for none.   All other systems are reviewed and negative.    PHYSICAL EXAM: VS:  BP (!) 144/64   Pulse (!) 55   Ht 5\' 10"  (1.778 m)   Wt 88.9 kg   SpO2 98%   BMI 28.12 kg/m  , BMI Body mass index is 28.12 kg/m. Affect appropriate Healthy:  appears stated age 33: normal Neck supple with no adenopathy JVP normal  no bruits no thyromegaly Lungs clear with no wheezing and good diaphragmatic motion Heart:  S1/S2 no murmur, no rub, gallop or click PMI normal Abdomen: benighn, BS positve, no tenderness, no AAA no bruit.  No HSM or HJR post Lap chole with IOC  Distal pulses intact with no bruits No edema Neuro non-focal Skin warm and dry No muscular weakness Right radial cath sight well healed    EKG:  SR rate 80 LBBB  01/07/19 SR rate 58 LBBB    Recent Labs: 08/28/2019: ALT 62; BUN 15; Creatinine, Ser 0.89; Hemoglobin 13.5; Platelets 182; Potassium 4.0; Sodium 134    Lipid Panel No results found for: CHOL, TRIG, HDL, CHOLHDL, VLDL, LDLCALC, LDLDIRECT    Wt Readings from Last 3 Encounters:  03/01/20 88.9 kg  08/28/19 87.5 kg  08/04/19 91.2 kg      Other studies Reviewed: Additional studies/ records that were reviewed today include: Notes from Dr Ernie Hew primary Carotid US, Calcium Score Labs and ECG .    ASSESSMENT AND PLAN:  1.  CAD:  Asymptomatic Calcium score of 205 which is about average for age ( 71 th percentile)  Cath 01/23/18 non obstructive disease   2. HTN:  Well controlled.  Continue current medications and low sodium Dash type diet.    3. HLD:  Continue crestor target LDL less than 70  4. Smoking:  Quit 2015 lung cancer screening CT 01/20/20 ok   5. LBBB :  No high grade heart block reflective of DCM   6. DCM:  EF 40% by TTE 01/07/19  Non ischemic normal filling pressures cath 01/23/18  F/u echo ordered   Year continue entresto   7. OSA:  Sleep study and referral to Dr Radford Pax ordered patient prefers to defer     Current medicines are reviewed at length with the patient today.  The patient does not have concerns regarding medicines.  The following changes have been made: None   Labs/ tests ordered today include: Echo for DCM   Direct time spent with patient 30 minutes   No orders of the defined types were placed in this encounter.    Disposition:   FU with  cardiology  In a year     Signed, Jenkins Rouge, MD  03/01/2020 [address], [place], [place]  [address] Phone: [phone]; Fax: [phone]

## 2020-03-01 ENCOUNTER — Other Ambulatory Visit: Payer: Self-pay

## 2020-03-01 ENCOUNTER — Encounter: Payer: Self-pay | Admitting: Cardiovascular Disease

## 2020-03-01 ENCOUNTER — Ambulatory Visit: Payer: PPO | Admitting: Cardiovascular Disease

## 2020-03-01 VITALS — BP 144/64 | HR 55 | Ht 70.0 in | Wt 196.0 lb

## 2020-03-01 DIAGNOSIS — I420 Dilated cardiomyopathy: Secondary | ICD-10-CM | POA: Diagnosis not present

## 2020-03-01 NOTE — Patient Instructions (Addendum)
Medication Instructions:  *If you need a refill on your cardiac medications before your next appointment, please call your pharmacy*  Lab Work: If you have labs (blood work) drawn today and your tests are completely normal, you will receive your results only by: Marland Kitchen MyChart Message (if you have MyChart) OR . A paper copy in the mail If you have any lab test that is abnormal or we need to change your treatment, we will call you to review the results.  Testing/Procedures: Your physician has requested that you have an echocardiogram. Echocardiography is a painless test that uses sound waves to create images of your heart. It provides your doctor with information about the size and shape of your heart and how well your heart's chambers and valves are working. This procedure takes approximately one hour. There are no restrictions for this procedure.  Follow-Up: At [facility], you and your health needs are our priority.  As part of our continuing mission to provide you with exceptional heart care, we have created designated Provider Care Teams.  These Care Teams include your primary Cardiologist (physician) and Advanced Practice Providers (APPs -  Physician Assistants and Nurse Practitioners) who all work together to provide you with the care you need, when you need it.  We recommend signing up for the patient portal called "MyChart".  Sign up information is provided on this After Visit Summary.  MyChart is used to connect with patients for Virtual Visits (Telemedicine).  Patients are able to view lab/test results, encounter notes, upcoming appointments, etc.  Non-urgent messages can be sent to your provider as well.   To learn more about what you can do with MyChart, go to [email or web address].    Your next appointment:   12 month(s)  The format for your next appointment:   In Person  Provider:   You may see Jenkins Rouge, MD or one of the following Advanced Practice Providers on your  designated Care Team:    Kathyrn Drown, NP

## 2020-03-22 ENCOUNTER — Ambulatory Visit (HOSPITAL_COMMUNITY): Payer: PPO | Attending: Internal Medicine

## 2020-03-22 ENCOUNTER — Other Ambulatory Visit: Payer: Self-pay

## 2020-03-22 DIAGNOSIS — I420 Dilated cardiomyopathy: Secondary | ICD-10-CM

## 2020-03-22 LAB — ECHOCARDIOGRAM COMPLETE
Area-P 1/2: 2.45 cm2
S' Lateral: 2.3 cm

## 2020-04-13 DIAGNOSIS — E559 Vitamin D deficiency, unspecified: Secondary | ICD-10-CM | POA: Diagnosis not present

## 2020-04-14 DIAGNOSIS — E559 Vitamin D deficiency, unspecified: Secondary | ICD-10-CM | POA: Diagnosis not present

## 2020-04-14 DIAGNOSIS — G4700 Insomnia, unspecified: Secondary | ICD-10-CM | POA: Diagnosis not present

## 2020-04-14 DIAGNOSIS — E663 Overweight: Secondary | ICD-10-CM | POA: Diagnosis not present

## 2020-04-14 DIAGNOSIS — I10 Essential (primary) hypertension: Secondary | ICD-10-CM | POA: Diagnosis not present

## 2020-04-14 DIAGNOSIS — I509 Heart failure, unspecified: Secondary | ICD-10-CM | POA: Diagnosis not present

## 2020-04-21 DIAGNOSIS — E663 Overweight: Secondary | ICD-10-CM | POA: Diagnosis not present

## 2020-04-21 DIAGNOSIS — I10 Essential (primary) hypertension: Secondary | ICD-10-CM | POA: Diagnosis not present

## 2020-04-21 DIAGNOSIS — E785 Hyperlipidemia, unspecified: Secondary | ICD-10-CM | POA: Diagnosis not present

## 2020-04-21 DIAGNOSIS — R739 Hyperglycemia, unspecified: Secondary | ICD-10-CM | POA: Diagnosis not present

## 2020-05-10 DIAGNOSIS — Z85828 Personal history of other malignant neoplasm of skin: Secondary | ICD-10-CM | POA: Diagnosis not present

## 2020-05-10 DIAGNOSIS — Z86018 Personal history of other benign neoplasm: Secondary | ICD-10-CM | POA: Diagnosis not present

## 2020-05-10 DIAGNOSIS — D2272 Melanocytic nevi of left lower limb, including hip: Secondary | ICD-10-CM | POA: Diagnosis not present

## 2020-05-10 DIAGNOSIS — L578 Other skin changes due to chronic exposure to nonionizing radiation: Secondary | ICD-10-CM | POA: Diagnosis not present

## 2020-05-10 DIAGNOSIS — L821 Other seborrheic keratosis: Secondary | ICD-10-CM | POA: Diagnosis not present

## 2020-05-10 DIAGNOSIS — L814 Other melanin hyperpigmentation: Secondary | ICD-10-CM | POA: Diagnosis not present

## 2020-05-10 DIAGNOSIS — D225 Melanocytic nevi of trunk: Secondary | ICD-10-CM | POA: Diagnosis not present

## 2020-05-12 DIAGNOSIS — J019 Acute sinusitis, unspecified: Secondary | ICD-10-CM | POA: Diagnosis not present

## 2020-06-21 DIAGNOSIS — Z23 Encounter for immunization: Secondary | ICD-10-CM | POA: Diagnosis not present

## 2020-11-01 DIAGNOSIS — I10 Essential (primary) hypertension: Secondary | ICD-10-CM | POA: Diagnosis not present

## 2020-11-01 DIAGNOSIS — Z0000 Encounter for general adult medical examination without abnormal findings: Secondary | ICD-10-CM | POA: Diagnosis not present

## 2020-11-01 DIAGNOSIS — I509 Heart failure, unspecified: Secondary | ICD-10-CM | POA: Diagnosis not present

## 2020-11-01 DIAGNOSIS — G4700 Insomnia, unspecified: Secondary | ICD-10-CM | POA: Diagnosis not present

## 2020-11-01 DIAGNOSIS — Z1331 Encounter for screening for depression: Secondary | ICD-10-CM | POA: Diagnosis not present

## 2020-11-01 DIAGNOSIS — Z1339 Encounter for screening examination for other mental health and behavioral disorders: Secondary | ICD-10-CM | POA: Diagnosis not present

## 2020-11-08 DIAGNOSIS — R739 Hyperglycemia, unspecified: Secondary | ICD-10-CM | POA: Diagnosis not present

## 2020-11-08 DIAGNOSIS — Z125 Encounter for screening for malignant neoplasm of prostate: Secondary | ICD-10-CM | POA: Diagnosis not present

## 2020-11-08 DIAGNOSIS — Z23 Encounter for immunization: Secondary | ICD-10-CM | POA: Diagnosis not present

## 2020-11-08 DIAGNOSIS — I10 Essential (primary) hypertension: Secondary | ICD-10-CM | POA: Diagnosis not present

## 2020-11-08 DIAGNOSIS — E559 Vitamin D deficiency, unspecified: Secondary | ICD-10-CM | POA: Diagnosis not present

## 2020-11-08 DIAGNOSIS — M109 Gout, unspecified: Secondary | ICD-10-CM | POA: Diagnosis not present

## 2020-11-10 DIAGNOSIS — M109 Gout, unspecified: Secondary | ICD-10-CM | POA: Diagnosis not present

## 2020-11-10 DIAGNOSIS — F331 Major depressive disorder, recurrent, moderate: Secondary | ICD-10-CM | POA: Diagnosis not present

## 2020-11-10 DIAGNOSIS — F411 Generalized anxiety disorder: Secondary | ICD-10-CM | POA: Diagnosis not present

## 2020-11-10 DIAGNOSIS — R7303 Prediabetes: Secondary | ICD-10-CM | POA: Diagnosis not present

## 2020-11-10 DIAGNOSIS — R739 Hyperglycemia, unspecified: Secondary | ICD-10-CM | POA: Diagnosis not present

## 2020-11-10 DIAGNOSIS — N529 Male erectile dysfunction, unspecified: Secondary | ICD-10-CM | POA: Diagnosis not present

## 2020-11-10 DIAGNOSIS — E559 Vitamin D deficiency, unspecified: Secondary | ICD-10-CM | POA: Diagnosis not present

## 2020-11-10 DIAGNOSIS — G4700 Insomnia, unspecified: Secondary | ICD-10-CM | POA: Diagnosis not present

## 2020-11-10 DIAGNOSIS — E782 Mixed hyperlipidemia: Secondary | ICD-10-CM | POA: Diagnosis not present

## 2020-11-14 ENCOUNTER — Other Ambulatory Visit: Payer: Self-pay | Admitting: Family Medicine

## 2020-11-14 DIAGNOSIS — Z87891 Personal history of nicotine dependence: Secondary | ICD-10-CM

## 2020-11-21 DIAGNOSIS — Z23 Encounter for immunization: Secondary | ICD-10-CM | POA: Diagnosis not present

## 2020-11-21 DIAGNOSIS — Z86018 Personal history of other benign neoplasm: Secondary | ICD-10-CM | POA: Diagnosis not present

## 2020-11-21 DIAGNOSIS — L814 Other melanin hyperpigmentation: Secondary | ICD-10-CM | POA: Diagnosis not present

## 2020-11-21 DIAGNOSIS — L578 Other skin changes due to chronic exposure to nonionizing radiation: Secondary | ICD-10-CM | POA: Diagnosis not present

## 2020-11-21 DIAGNOSIS — D2272 Melanocytic nevi of left lower limb, including hip: Secondary | ICD-10-CM | POA: Diagnosis not present

## 2020-11-21 DIAGNOSIS — D225 Melanocytic nevi of trunk: Secondary | ICD-10-CM | POA: Diagnosis not present

## 2020-11-21 DIAGNOSIS — Z85828 Personal history of other malignant neoplasm of skin: Secondary | ICD-10-CM | POA: Diagnosis not present

## 2020-11-21 DIAGNOSIS — L821 Other seborrheic keratosis: Secondary | ICD-10-CM | POA: Diagnosis not present

## 2020-12-02 ENCOUNTER — Emergency Department (HOSPITAL_BASED_OUTPATIENT_CLINIC_OR_DEPARTMENT_OTHER): Payer: PPO

## 2020-12-02 ENCOUNTER — Encounter (HOSPITAL_BASED_OUTPATIENT_CLINIC_OR_DEPARTMENT_OTHER): Payer: Self-pay | Admitting: *Deleted

## 2020-12-02 ENCOUNTER — Emergency Department (HOSPITAL_BASED_OUTPATIENT_CLINIC_OR_DEPARTMENT_OTHER)
Admission: EM | Admit: 2020-12-02 | Discharge: 2020-12-02 | Disposition: A | Discharge status: Home / Self Care | Payer: PPO | Attending: Emergency Medicine | Admitting: Emergency Medicine

## 2020-12-02 ENCOUNTER — Other Ambulatory Visit: Payer: Self-pay

## 2020-12-02 DIAGNOSIS — I10 Essential (primary) hypertension: Secondary | ICD-10-CM | POA: Insufficient documentation

## 2020-12-02 DIAGNOSIS — R4781 Slurred speech: Secondary | ICD-10-CM | POA: Diagnosis not present

## 2020-12-02 DIAGNOSIS — R531 Weakness: Secondary | ICD-10-CM | POA: Diagnosis not present

## 2020-12-02 DIAGNOSIS — Z7982 Long term (current) use of aspirin: Secondary | ICD-10-CM | POA: Insufficient documentation

## 2020-12-02 DIAGNOSIS — R600 Localized edema: Secondary | ICD-10-CM | POA: Diagnosis not present

## 2020-12-02 DIAGNOSIS — Z87891 Personal history of nicotine dependence: Secondary | ICD-10-CM | POA: Diagnosis not present

## 2020-12-02 DIAGNOSIS — R5383 Other fatigue: Secondary | ICD-10-CM | POA: Insufficient documentation

## 2020-12-02 LAB — CBC
HCT: 43.4 % (ref 39.0–52.0)
Hemoglobin: 14.4 g/dL (ref 13.0–17.0)
MCH: 32.4 pg (ref 26.0–34.0)
MCHC: 33.2 g/dL (ref 30.0–36.0)
MCV: 97.7 fL (ref 80.0–100.0)
Platelets: 199 10*3/uL (ref 150–400)
RBC: 4.44 MIL/uL (ref 4.22–5.81)
RDW: 13.3 % (ref 11.5–15.5)
WBC: 7.2 10*3/uL (ref 4.0–10.5)
nRBC: 0 % (ref 0.0–0.2)

## 2020-12-02 LAB — BASIC METABOLIC PANEL
Anion gap: 15 (ref 5–15)
BUN: 23 mg/dL (ref 8–23)
CO2: 22 mmol/L (ref 22–32)
Calcium: 8.4 mg/dL — ABNORMAL LOW (ref 8.9–10.3)
Chloride: 100 mmol/L (ref 98–111)
Creatinine, Ser: 1.02 mg/dL (ref 0.61–1.24)
GFR, Estimated: >60 mL/min (ref >60)
Glucose, Bld: 193 mg/dL — ABNORMAL HIGH (ref 70–99)
Potassium: 3.7 mmol/L (ref 3.5–5.1)
Sodium: 137 mmol/L (ref 135–145)

## 2020-12-02 LAB — CBG MONITORING, ED: Glucose-Capillary: 180 mg/dL — ABNORMAL HIGH (ref 70–99)

## 2020-12-02 MED ORDER — CARVEDILOL 6.25 MG PO TABS
3.1250 mg | ORAL_TABLET | Freq: Once | ORAL | Status: AC
  Administered 2020-12-02: 3.125 mg via ORAL
  Filled 2020-12-02: qty 1

## 2020-12-02 MED ORDER — SACUBITRIL-VALSARTAN 49-51 MG PO TABS
1.0000 | ORAL_TABLET | Freq: Once | ORAL | Status: AC
  Administered 2020-12-02: 1 via ORAL
  Filled 2020-12-02: qty 1

## 2020-12-02 NOTE — ED Notes (Signed)
ED Provider at bedside. 

## 2020-12-02 NOTE — ED Triage Notes (Addendum)
Pt had dental surgery on Wednesday, still has swelling on left jaw. Pt feels fatigue and weak, Modified NIHS scale Neg other than facial difference due to dental work. No N/V/D   Wife states he had his head down and garbled speech when she come back in from walking. None on arrival

## 2020-12-02 NOTE — ED Provider Notes (Signed)
[facility] EMERGENCY DEPT Provider Note   CSN: [id number] Arrival date & time: 12/02/20  1308     History Chief Complaint  Patient presents with   Fatigue   Weakness    Frank Foster is a 75 y.o. male.   Weakness Associated symptoms: no abdominal pain, no headaches and no shortness of breath   Patient presents with generalized weakness.  Had dental surgery 2 days ago.  Had some work done on his left jaw.  States there is some pain with that and has been taking Tylenol and Motrin.  This morning was feeling more fatigued.  Got up.  Reportedly was feeling weak all over.  Reportedly per wife was having slurred speech and some confusion.  Difficulty getting his words out.  States it was not exactly slurred but almost if they were getting stuck.  Feeling somewhat better now.  Reportedly blood pressure was 657 systolic and then later rechecked at 846 systolic.  Does have some left-sided facial changes since his surgery 2 days ago.  No chest pain.  No fevers or chills.  Thought his sugar may have been low but ate food.  Feeling somewhat better now but not quite back to baseline.  Normal speech.    Past Medical History:  Diagnosis Date   Body mass index (bmi) 28.0-28.9, adult    Gout    Hyperlipidemia    Hypertension     Patient Active Problem List   Diagnosis Date Noted   Acute cholecystitis 08/27/2019   Dilated cardiomyopathy [facility])     Past Surgical History:  Procedure Laterality Date   broken nose     CHOLECYSTECTOMY N/A 08/28/2019   Procedure: LAPAROSCOPIC CHOLECYSTECTOMY WITH INTRAOPERATIVE CHOLANGIOGRAM;  Surgeon: Johnathan Hausen, MD;  Location: [facility];  Service: General;  Laterality: N/A;   KNEE SURGERY     arthroscopic right knee   RIGHT/LEFT HEART CATH AND CORONARY ANGIOGRAPHY N/A 01/23/2018   Procedure: RIGHT/LEFT HEART CATH AND CORONARY ANGIOGRAPHY;  Surgeon: Burnell Blanks, MD;  Location: [facility];  Service: Cardiovascular;   Laterality: N/A;       Family History  Problem Relation Age of Onset   Heart disease Mother    Prostate cancer Father    Heart disease Father     Social History   Tobacco Use   Smoking status: Former    Types: Cigarettes    Quit date: 04/29/2013    Years since quitting: 7.6   Smokeless tobacco: Never  Vaping Use   Vaping Use: Never used  Substance Use Topics   Alcohol use: Yes    Alcohol/week: 5.0 standard drinks    Types: 3 Glasses of wine, 2 Shots of liquor per week   Drug use: No    Home Medications Prior to Admission medications   Medication Sig Start Date End Date Taking? Authorizing Provider  acetaminophen (TYLENOL) 325 MG tablet Take 650 mg by mouth every 6 (six) hours as needed for mild pain or headache.   Yes [provider]  allopurinol (ZYLOPRIM) 100 MG tablet Take 100 mg by mouth every evening.    Yes [provider]  aspirin EC 81 MG tablet Take 81 mg by mouth every evening.   Yes [provider]  carvedilol (COREG) 3.125 MG tablet Take 1 tablet (3.125 mg total) by mouth 2 (two) times daily. 01/15/18  Yes Josue Hector, MD  rosuvastatin (CRESTOR) 5 MG tablet Take 5 mg by mouth every evening.    Yes  [provider]  sacubitril-valsartan (ENTRESTO) 49-51 MG Take 1 tablet by mouth 2 (two) times daily. 09/29/19  Yes Josue Hector, MD  zolpidem (AMBIEN) 5 MG tablet Take 5 mg by mouth at bedtime. 08/14/19  Yes [provider]  HYDROcodone-acetaminophen (NORCO/VICODIN) 5-325 MG tablet Take 1 tablet by mouth every 6 (six) hours as needed for moderate pain. 08/28/19   Johnathan Hausen, MD    Allergies    Penicillins  Review of Systems   Review of Systems  Constitutional:  Positive for fatigue. Negative for activity change.  HENT:  Negative for congestion.   Respiratory:  Negative for shortness of breath.   Gastrointestinal:  Negative for abdominal pain.  Genitourinary:  Negative for flank pain.  Musculoskeletal:   Negative for back pain.  Skin:  Negative for rash.  Neurological:  Positive for speech difficulty and weakness. Negative for headaches.  Psychiatric/Behavioral:  Negative for confusion.    Physical Exam Updated Vital Signs BP (!) 156/68   Pulse 71   Temp 98.3 F (36.8 C) (Oral)   Resp 20   Ht 5\' 10"  (1.778 m)   Wt 86.2 kg   SpO2 98%   BMI 27.26 kg/m   Physical Exam Vitals and nursing note reviewed.  HENT:     Head: Atraumatic.     Mouth/Throat:     Comments: Mild tenderness of jaw on left side.  Mild swelling of cheek. Eyes:     Pupils: Pupils are equal, round, and reactive to light.  Cardiovascular:     Rate and Rhythm: Regular rhythm.  Pulmonary:     Breath sounds: No wheezing or rhonchi.  Musculoskeletal:        General: No tenderness.     Cervical back: Neck supple.  Skin:    General: Skin is warm.     Capillary Refill: Capillary refill takes less than 2 seconds.  Neurological:     Mental Status: He is alert and oriented to person, place, and time.     Comments: Face symmetric except for the swelling on the left lower face.  Eye movements intact.  Sensation grossly intact bilaterally.  Awake and appropriate.  Moving all extremities    ED Results / Procedures / Treatments   Labs (all labs ordered are listed, but only abnormal results are displayed) Labs Reviewed  BASIC METABOLIC PANEL - Abnormal; Notable for the following components:      Result Value   Glucose, Bld 193 (*)    Calcium 8.4 (*)    All other components within normal limits  CBG MONITORING, ED - Abnormal; Notable for the following components:   Glucose-Capillary 180 (*)    All other components within normal limits  CBC    EKG EKG Interpretation  Date/Time:  Friday December 02 2020 08:49:17 EDT Ventricular Rate:  77 PR Interval:  178 QRS Duration: 157 QT Interval:  439 QTC Calculation: 497 R Axis:   19 Text Interpretation: Sinus rhythm Left bundle branch block No significant change  since last tracing Confirmed by Davonna Belling [phone]) on 12/02/2020 9:01:52 AM  Radiology CT Head Wo Contrast  Result Date: 12/02/2020 CLINICAL DATA:  Weakness.  Facial swelling after dental work. EXAM: CT HEAD WITHOUT CONTRAST TECHNIQUE: Contiguous axial images were obtained from the base of the skull through the vertex without intravenous contrast. COMPARISON:  None. FINDINGS: Brain: No evidence of acute infarction, hemorrhage, hydrocephalus, extra-axial collection or mass lesion/mass effect. Vascular: No hyperdense vessel or unexpected calcification. Skull: Normal. Negative for  fracture or focal lesion. Sinuses/Orbits: No acute finding. Other: None. IMPRESSION: No acute intracranial abnormality seen. Electronically Signed   By: Marijo Conception M.D.   On: 12/02/2020 09:46   DG Chest Portable 1 View  Result Date: 12/02/2020 CLINICAL DATA:  Weakness. EXAM: PORTABLE CHEST 1 VIEW COMPARISON:  March 26, 2017. FINDINGS: The heart size and mediastinal contours are within normal limits. Both lungs are clear. The visualized skeletal structures are unremarkable. IMPRESSION: No active disease. Electronically Signed   By: Marijo Conception M.D.   On: 12/02/2020 09:48    Procedures Procedures   Medications Ordered in ED Medications  carvedilol (COREG) tablet 3.125 mg (3.125 mg Oral Given 12/02/20 0929)  sacubitril-valsartan (ENTRESTO) 49-51 mg per tablet (1 tablet Oral Given 12/02/20 1111)    ED Course  I have reviewed the triage vital signs and the nursing notes.  Pertinent labs & imaging results that were available during my care of the patient were reviewed by me and considered in my medical decision making (see chart for details).    MDM Rules/Calculators/A&P                           Patient presents with generalized weakness.  Began this morning.  Had some jaw surgery couple days ago.  Has just been taking Motrin Tylenol.  Reportedly elevated blood pressure at home.  Blood pressure  improved here.  Had reportedly been a little unsteady and generally weak.  Also some potentially garbled speech.  Now appears to back at baseline.  Lab work overall reassuring.  Head CT and chest x-ray reassuring.  Potentially could be related to hypertension or just generalized weakness with the recent procedure.  Arrhythmia or stroke felt less likely.  Patient appears stable for discharge home.  No urinary symptoms.  Will discharge home with PCP follow-up as needed. Final Clinical Impression(s) / ED Diagnoses Final diagnoses:  Weakness    Rx / DC Orders ED Discharge Orders     None        Davonna Belling, MD 12/02/20 1246

## 2020-12-02 NOTE — Discharge Instructions (Signed)
Follow-up with your primary care doctor for further evaluation treatment.  Your sugar was mildly elevated today.

## 2021-01-04 DIAGNOSIS — I10 Essential (primary) hypertension: Secondary | ICD-10-CM | POA: Diagnosis not present

## 2021-01-04 DIAGNOSIS — G4700 Insomnia, unspecified: Secondary | ICD-10-CM | POA: Diagnosis not present

## 2021-01-04 DIAGNOSIS — F331 Major depressive disorder, recurrent, moderate: Secondary | ICD-10-CM | POA: Diagnosis not present

## 2021-01-04 DIAGNOSIS — F411 Generalized anxiety disorder: Secondary | ICD-10-CM | POA: Diagnosis not present

## 2021-01-04 DIAGNOSIS — N529 Male erectile dysfunction, unspecified: Secondary | ICD-10-CM | POA: Diagnosis not present

## 2021-02-28 NOTE — Progress Notes (Incomplete)
Cardiology Office Note   Date:  02/28/2021   ID:  CLAUDELL WOHLER, DOB 25-Nov-1945, MRN [id number]  PCP:  Fanny Bien, MD  Cardiologist:   Jenkins Rouge, MD   No chief complaint on file.     History of Present Illness: Frank Foster is a 76 y.o. male who presents for f/u regarding CAD/DCM. He is a former smoker quitting in 2015. History of HTN and HLD. Had screening carotid and calcium score done 10/17/17 Carotids with mild plaque no stenosis Calcium score with mild 3 vessel calcium with score of 205 This was incorrectly labeled as 32 th percentile for age and sex as it is only 20 th percentile for age and sex. He has no cardiac symptoms   He has no chest pain. He is somewhat sedentary Walks 3x/week and golfs a bit. Long discussion with Him about his ECG showing LBBB.   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9, adult    Gout    Hyperlipidemia    Hypertension     Past Surgical History:  Procedure Laterality Date   broken nose     CHOLECYSTECTOMY N/A 08/28/2019   Procedure: LAPAROSCOPIC CHOLECYSTECTOMY WITH INTRAOPERATIVE CHOLANGIOGRAM;  Surgeon: Johnathan Hausen, MD;  Location: [facility];  Service: General;  Laterality: N/A;   KNEE SURGERY     arthroscopic right knee   RIGHT/LEFT HEART CATH AND CORONARY ANGIOGRAPHY N/A 01/23/2018    Procedure: RIGHT/LEFT HEART CATH AND CORONARY ANGIOGRAPHY;  Surgeon: Burnell Blanks, MD;  Location: [facility];  Service: Cardiovascular;  Laterality: N/A;     Current Outpatient Medications  Medication Sig Dispense Refill   acetaminophen (TYLENOL) 325 MG tablet Take 650 mg by mouth every 6 (six) hours as needed for mild pain or headache.     allopurinol (ZYLOPRIM) 100 MG tablet Take 100 mg by mouth every evening.      aspirin EC 81 MG tablet Take 81 mg by mouth every evening.     carvedilol (COREG) 3.125 MG tablet Take 1 tablet (3.125 mg total) by mouth 2 (two) times daily. 180 tablet 3   HYDROcodone-acetaminophen (NORCO/VICODIN) 5-325 MG tablet Take 1 tablet by mouth every 6 (six) hours as needed for moderate pain. 15 tablet 0   rosuvastatin (CRESTOR) 5 MG tablet Take 5 mg by mouth every evening.      sacubitril-valsartan (ENTRESTO) 49-51 MG Take 1 tablet by mouth 2 (two) times daily. 180 tablet 3   zolpidem (AMBIEN) 5 MG tablet Take 5 mg by mouth at bedtime.     No current facility-administered medications for this visit.    Allergies:   Penicillins    Social History:  The patient  reports that he quit smoking about 7 years ago. His smoking use included cigarettes. He  has never used smokeless tobacco. He reports current alcohol use of about 5.0 standard drinks per week. He reports that he does not use drugs.   Family History:  The patient's family history includes Heart disease in his father and mother; Prostate cancer in his father.    ROS:  Please see the history of present illness.   Otherwise, review of systems are positive for none.   All other systems are reviewed and negative.    PHYSICAL EXAM: VS:  There were no vitals taken for this visit. , BMI There is no height or weight on file to calculate BMI. Affect appropriate Healthy:  appears stated age 42: normal Neck supple with no adenopathy JVP normal no bruits no thyromegaly Lungs clear with no  wheezing and good diaphragmatic motion Heart:  S1/S2 no murmur, no rub, gallop or click PMI normal Abdomen: benighn, BS positve, no tenderness, no AAA no bruit.  No HSM or HJR post Lap chole with IOC  Distal pulses intact with no bruits No edema Neuro non-focal Skin warm and dry No muscular weakness Right radial cath sight well healed    EKG:  SR rate 80 LBBB  01/07/19 SR rate 58 LBBB    Recent Labs: 12/02/2020: BUN 23; Creatinine, Ser 1.02; Hemoglobin 14.4; Platelets 199; Potassium 3.7; Sodium 137    Lipid Panel No results found for: CHOL, TRIG, HDL, CHOLHDL, VLDL, LDLCALC, LDLDIRECT    Wt Readings from Last 3 Encounters:  12/02/20 190 lb (86.2 kg)  03/01/20 196 lb (88.9 kg)  08/28/19 193 lb (87.5 kg)      Other studies Reviewed: Additional studies/ records that were reviewed today include: Notes from Dr Ernie Hew primary Carotid US, Calcium Score Labs and ECG .    ASSESSMENT AND PLAN:  1.  CAD:  Asymptomatic Calcium score of 205 which is about average for age ( 79 th percentile)  Cath 01/23/18 non obstructive disease   2. HTN:  Well controlled.  Continue current medications and low sodium Dash type diet.    3. HLD:  Continue crestor target LDL less than 70  4. Smoking:  Quit 2015 lung cancer screening CT 01/20/20 ok   5. LBBB :  No high grade heart block reflective of DCM   6. DCM:  EF 40% by TTE 03/22/20 stable Non ischemic normal filling pressures cath 01/23/18  Continue coreg and entresto ***  7. OSA:  Sleep study and referral to Dr Radford Pax ordered patient prefers to defer     Current medicines are reviewed at length with the patient today.  The patient does not have concerns regarding medicines.  The following changes have been made: None   Labs/ tests ordered today include: ***  Direct time spent with patient 30 minutes   No orders of the defined types were placed in this encounter.    Disposition:   FU with cardiology  In a year      Signed, Jenkins Rouge, MD  02/28/2021 4:20 PM    [facility] [place], [place], [place]  [address] Phone: [phone]; Fax: [phone]

## 2021-03-01 DIAGNOSIS — I10 Essential (primary) hypertension: Secondary | ICD-10-CM | POA: Diagnosis not present

## 2021-03-01 DIAGNOSIS — M7062 Trochanteric bursitis, left hip: Secondary | ICD-10-CM | POA: Diagnosis not present

## 2021-03-01 DIAGNOSIS — M545 Low back pain, unspecified: Secondary | ICD-10-CM | POA: Diagnosis not present

## 2021-03-01 DIAGNOSIS — G4700 Insomnia, unspecified: Secondary | ICD-10-CM | POA: Diagnosis not present

## 2021-03-01 DIAGNOSIS — N529 Male erectile dysfunction, unspecified: Secondary | ICD-10-CM | POA: Diagnosis not present

## 2021-03-05 DIAGNOSIS — U071 COVID-19: Secondary | ICD-10-CM | POA: Diagnosis not present

## 2021-03-05 DIAGNOSIS — M719 Bursopathy, unspecified: Secondary | ICD-10-CM | POA: Diagnosis not present

## 2021-03-05 DIAGNOSIS — I10 Essential (primary) hypertension: Secondary | ICD-10-CM | POA: Diagnosis not present

## 2021-03-06 ENCOUNTER — Ambulatory Visit: Payer: PPO | Admitting: Cardiovascular Disease

## 2021-03-06 ENCOUNTER — Encounter: Payer: Self-pay | Admitting: Cardiovascular Disease

## 2021-04-26 DIAGNOSIS — I509 Heart failure, unspecified: Secondary | ICD-10-CM | POA: Diagnosis not present

## 2021-04-26 DIAGNOSIS — I10 Essential (primary) hypertension: Secondary | ICD-10-CM | POA: Diagnosis not present

## 2021-04-26 DIAGNOSIS — N529 Male erectile dysfunction, unspecified: Secondary | ICD-10-CM | POA: Diagnosis not present

## 2021-05-22 DIAGNOSIS — Z86018 Personal history of other benign neoplasm: Secondary | ICD-10-CM | POA: Diagnosis not present

## 2021-05-22 DIAGNOSIS — D2272 Melanocytic nevi of left lower limb, including hip: Secondary | ICD-10-CM | POA: Diagnosis not present

## 2021-05-22 DIAGNOSIS — L821 Other seborrheic keratosis: Secondary | ICD-10-CM | POA: Diagnosis not present

## 2021-05-22 DIAGNOSIS — L578 Other skin changes due to chronic exposure to nonionizing radiation: Secondary | ICD-10-CM | POA: Diagnosis not present

## 2021-05-22 DIAGNOSIS — L814 Other melanin hyperpigmentation: Secondary | ICD-10-CM | POA: Diagnosis not present

## 2021-05-22 DIAGNOSIS — Z85828 Personal history of other malignant neoplasm of skin: Secondary | ICD-10-CM | POA: Diagnosis not present

## 2021-05-22 DIAGNOSIS — D225 Melanocytic nevi of trunk: Secondary | ICD-10-CM | POA: Diagnosis not present

## 2021-05-27 NOTE — Progress Notes (Signed)
?  ?Cardiology Office Note ? ? ?Date:  05/29/2021  ? ?ID:  Frank Foster, DOB 1946-01-16, MRN [id number] ? ?PCP:  Frank Bien, MD  ?Cardiologist:   Frank Rouge, MD  ? ?No chief complaint on file. ? ? ?  ?History of Present Illness: ?Frank Foster is a 76 y.o. male who presents for f/u regarding CAD/DCM. He is a former smoker quitting in 2015. History of HTN and HLD. Had screening carotid and calcium score Foster 10/17/17 Carotids with mild plaque no stenosis Calcium score with mild 3 vessel calcium with score of 205 This was incorrectly labeled as 81 th percentile for age and sex as it is only 56 th percentile for age and sex. He has no cardiac symptoms  ? ?He has no chest pain. He is somewhat sedentary Walks 3x/week and golfs a bit. Long discussion with Him about his ECG showing LBBB.  ? 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 ?TTE Foster 03/22/20 EF 40%  ?Myovue  01/15/18 with anteroseptal , apical anterior defect fixed EF 36% ? ?Cath 01/23/18 by Frank Frank Foster showed no CAD and normal filling pressures mean PCWP 8 mmHg  Started on Entresto and coreg On statin for HLD   ? ?Their son Frank Foster lawyer  has Foster some real estate closings with my brother  ?Other son finally got his own insurance agency doing property policies  ? ?Had lap chole for acute cholecystitis by Frank Frank Foster 08/28/19  ? ?Compliant with meds Functional class one Walks over 10 miles/week ? ? ?Past Medical History:  ?Diagnosis Date  ? Body mass index (bmi) 28.0-28.9, adult   ? Gout   ? Hyperlipidemia   ? Hypertension   ? ? ?Past Surgical History:  ?Procedure Laterality Date  ? broken nose    ? CHOLECYSTECTOMY N/A 08/28/2019  ? Procedure: LAPAROSCOPIC CHOLECYSTECTOMY WITH INTRAOPERATIVE CHOLANGIOGRAM;  Surgeon: Frank Hausen, MD;  Location: [facility];  Service: General;  Laterality: N/A;  ? KNEE SURGERY    ? arthroscopic right knee  ? RIGHT/LEFT HEART CATH AND CORONARY ANGIOGRAPHY  N/A 01/23/2018  ? Procedure: RIGHT/LEFT HEART CATH AND CORONARY ANGIOGRAPHY;  Surgeon: Frank Blanks, MD;  Location: [facility];  Service: Cardiovascular;  Laterality: N/A;  ? ? ? ?Current Outpatient Medications  ?Medication Sig Dispense Refill  ? acetaminophen (TYLENOL) 325 MG tablet Take 650 mg by mouth every 6 (six) hours as needed for mild pain or headache.    ? allopurinol (ZYLOPRIM) 100 MG tablet Take 100 mg by mouth every evening.     ? aspirin EC 81 MG tablet Take 81 mg by mouth every evening.    ? carvedilol (COREG) 3.125 MG tablet Take 1 tablet (3.125 mg total) by mouth 2 (two) times daily. 180 tablet 3  ? HYDROcodone-acetaminophen (NORCO/VICODIN) 5-325 MG tablet Take 1 tablet by mouth every 6 (six) hours as needed for moderate pain. 15 tablet 0  ? rosuvastatin (CRESTOR) 5 MG tablet Take 5 mg by mouth every evening.     ? sacubitril-valsartan (ENTRESTO) 49-51 MG Take 1 tablet by mouth 2 (two) times daily. 180 tablet 3  ? zolpidem (AMBIEN) 5 MG tablet Take 5 mg by mouth at bedtime.    ? ?No current facility-administered medications for this visit.  ? ? ?Allergies:   Penicillins  ? ? ?Social History:  The patient  reports that he quit smoking about 8 years ago. His smoking  use included cigarettes. He has never used smokeless tobacco. He reports current alcohol use of about 5.0 standard drinks per week. He reports that he does not use drugs.  ? ?Family History:  The patient's family history includes Heart disease in his father and mother; Prostate cancer in his father.  ? ? ?ROS:  Please see the history of present illness.   Otherwise, review of systems are positive for none.   All other systems are reviewed and negative.  ? ? ?PHYSICAL EXAM: ?VS:  BP 136/74   Pulse 61   Ht '5\' 10"'$  (1.778 m)   Wt 208 lb (94.3 kg)   SpO2 95%   BMI 29.84 kg/m?  , BMI Body mass index is 29.84 kg/m?. ?Affect appropriate ?Healthy:  appears stated age ?HEENT: normal ?Neck supple with no adenopathy ?JVP normal  no bruits no thyromegaly ?Lungs clear with no wheezing and good diaphragmatic motion ?Heart:  S1/S2 no murmur, no rub, gallop or click ?PMI normal ?Abdomen: benighn, post lap choly  ?no bruit.  No HSM or HJR post Lap chole with IOC  ?Distal pulses intact with no bruits ?No edema ?Neuro non-focal ?Skin warm and dry ?No muscular weakness ?Right radial cath sight well healed  ? ? ?EKG:  SR rate 80 LBBB  01/07/19 SR rate 58 LBBB  ? ? ?Recent Labs: ?12/02/2020: BUN 23; Creatinine, Ser 1.02; Hemoglobin 14.4; Platelets 199; Potassium 3.7; Sodium 137  ? ? ?Lipid Panel ?No results found for: CHOL, TRIG, HDL, CHOLHDL, VLDL, LDLCALC, LDLDIRECT ?  ? ?Wt Readings from Last 3 Encounters:  ?05/29/21 208 lb (94.3 kg)  ?12/02/20 190 lb (86.2 kg)  ?03/01/20 196 lb (88.9 kg)  ?  ? ? ?Other studies Reviewed: ?Additional studies/ records that were reviewed today include: Notes from Frank Foster primary Carotid US, Calcium Score TTE ?Labs and ECG . ? ? ? ?ASSESSMENT AND PLAN: ? ?1.  CAD:  Asymptomatic Calcium score of 205 which is about average for age ( 2 th percentile)  Cath 01/23/18 non obstructive disease  ? ?2. HTN:  Well controlled.  Continue current medications and low sodium Dash type diet.   ? ?3. HLD:  Continue crestor target LDL less than 70 ? ?4. Smoking:  Quit 2015 lung cancer screening CT 01/20/20 ok repeat per Frank Foster  ? ?5. LBBB :  No high grade heart block reflective of DCM  ? ?6. DCM:  EF 40% by TTE 03/22/20  Non ischemic normal filling pressures cath 01/23/18  F/u echo ordered  Continue entresto and coreg ? ?7. OSA:  Sleep study and referral to Frank Foster ordered patient prefers to defer  ? ? ? ?Current medicines are reviewed at length with the patient today.  The patient does not have concerns regarding medicines. ? ?The following changes have been made: None  ? ?Labs/ tests ordered today include: Echo for DCM  ? ?Direct time spent with patient 30 minutes  ? ?No orders of the defined types were placed in this  encounter. ? ? ? ?Disposition:   FU with cardiology  In a year  ? ? ? ?Signed, ?Frank Rouge, MD  ?05/29/2021 3:30 PM    ?[facility], [place], [place]  [address] ?Phone: [phone]; Fax: [phone]  ?

## 2021-05-29 ENCOUNTER — Encounter: Payer: Self-pay | Admitting: Cardiovascular Disease

## 2021-05-29 ENCOUNTER — Ambulatory Visit: Payer: PPO | Admitting: Cardiovascular Disease

## 2021-05-29 VITALS — BP 136/74 | HR 61 | Ht 70.0 in | Wt 208.0 lb

## 2021-05-29 DIAGNOSIS — I420 Dilated cardiomyopathy: Secondary | ICD-10-CM | POA: Diagnosis not present

## 2021-05-29 NOTE — Patient Instructions (Signed)
Medication Instructions:  ?Your physician recommends that you continue on your current medications as directed. Please refer to the Current Medication list given to you today. ? ?*If you need a refill on your cardiac medications before your next appointment, please call your pharmacy* ? ?Lab Work: ?If you have labs (blood work) drawn today and your tests are completely normal, you will receive your results only by: ?MyChart Message (if you have MyChart) OR ?A paper copy in the mail ?If you have any lab test that is abnormal or we need to change your treatment, we will call you to review the results. ? ?Testing/Procedures: ?Your physician has requested that you have an echocardiogram. Echocardiography is a painless test that uses sound waves to create images of your heart. It provides your doctor with information about the size and shape of your heart and how well your heart?s chambers and valves are working. This procedure takes approximately one hour. There are no restrictions for this procedure. ? ?Follow-Up: ?At [facility], you and your health needs are our priority.  As part of our continuing mission to provide you with exceptional heart care, we have created designated Provider Care Teams.  These Care Teams include your primary Cardiologist (physician) and Advanced Practice Providers (APPs -  Physician Assistants and Nurse Practitioners) who all work together to provide you with the care you need, when you need it. ? ?We recommend signing up for the patient portal called "MyChart".  Sign up information is provided on this After Visit Summary.  MyChart is used to connect with patients for Virtual Visits (Telemedicine).  Patients are able to view lab/test results, encounter notes, upcoming appointments, etc.  Non-urgent messages can be sent to your provider as well.   ?To learn more about what you can do with MyChart, go to [email or web address].   ? ?Your next appointment:   ?1 year(s) ? ?The format for  your next appointment:   ?In Person ? ?Provider:   ?Jenkins Rouge, MD { ? ? ?Important Information About Sugar ? ? ? ? ?  ?

## 2021-06-14 DIAGNOSIS — I10 Essential (primary) hypertension: Secondary | ICD-10-CM | POA: Diagnosis not present

## 2021-06-14 DIAGNOSIS — I509 Heart failure, unspecified: Secondary | ICD-10-CM | POA: Diagnosis not present

## 2021-06-15 ENCOUNTER — Ambulatory Visit: Payer: PPO | Admitting: Cardiovascular Disease

## 2021-06-15 ENCOUNTER — Ambulatory Visit (HOSPITAL_COMMUNITY): Payer: PPO | Attending: Cardiovascular Disease

## 2021-06-15 DIAGNOSIS — I420 Dilated cardiomyopathy: Secondary | ICD-10-CM | POA: Insufficient documentation

## 2021-06-17 LAB — ECHOCARDIOGRAM COMPLETE
Area-P 1/2: 3.17 cm2
S' Lateral: 2.85 cm

## 2021-06-20 DIAGNOSIS — I509 Heart failure, unspecified: Secondary | ICD-10-CM | POA: Diagnosis not present

## 2021-06-20 DIAGNOSIS — M7062 Trochanteric bursitis, left hip: Secondary | ICD-10-CM | POA: Diagnosis not present

## 2021-06-20 DIAGNOSIS — I10 Essential (primary) hypertension: Secondary | ICD-10-CM | POA: Diagnosis not present

## 2021-11-07 DIAGNOSIS — E559 Vitamin D deficiency, unspecified: Secondary | ICD-10-CM | POA: Diagnosis not present

## 2021-11-07 DIAGNOSIS — N529 Male erectile dysfunction, unspecified: Secondary | ICD-10-CM | POA: Diagnosis not present

## 2021-11-07 DIAGNOSIS — Z125 Encounter for screening for malignant neoplasm of prostate: Secondary | ICD-10-CM | POA: Diagnosis not present

## 2021-11-07 DIAGNOSIS — E782 Mixed hyperlipidemia: Secondary | ICD-10-CM | POA: Diagnosis not present

## 2021-11-07 DIAGNOSIS — N329 Bladder disorder, unspecified: Secondary | ICD-10-CM | POA: Diagnosis not present

## 2021-11-14 DIAGNOSIS — E782 Mixed hyperlipidemia: Secondary | ICD-10-CM | POA: Diagnosis not present

## 2021-11-14 DIAGNOSIS — E559 Vitamin D deficiency, unspecified: Secondary | ICD-10-CM | POA: Diagnosis not present

## 2021-11-14 DIAGNOSIS — I10 Essential (primary) hypertension: Secondary | ICD-10-CM | POA: Diagnosis not present

## 2021-11-14 DIAGNOSIS — M7070 Other bursitis of hip, unspecified hip: Secondary | ICD-10-CM | POA: Diagnosis not present

## 2021-11-14 DIAGNOSIS — G4700 Insomnia, unspecified: Secondary | ICD-10-CM | POA: Diagnosis not present

## 2021-11-14 DIAGNOSIS — M109 Gout, unspecified: Secondary | ICD-10-CM | POA: Diagnosis not present

## 2021-11-14 DIAGNOSIS — I509 Heart failure, unspecified: Secondary | ICD-10-CM | POA: Diagnosis not present

## 2021-11-15 DIAGNOSIS — Z23 Encounter for immunization: Secondary | ICD-10-CM | POA: Diagnosis not present

## 2021-11-21 DIAGNOSIS — L821 Other seborrheic keratosis: Secondary | ICD-10-CM | POA: Diagnosis not present

## 2021-11-21 DIAGNOSIS — D225 Melanocytic nevi of trunk: Secondary | ICD-10-CM | POA: Diagnosis not present

## 2021-11-21 DIAGNOSIS — Z85828 Personal history of other malignant neoplasm of skin: Secondary | ICD-10-CM | POA: Diagnosis not present

## 2021-11-21 DIAGNOSIS — D2272 Melanocytic nevi of left lower limb, including hip: Secondary | ICD-10-CM | POA: Diagnosis not present

## 2021-11-21 DIAGNOSIS — L814 Other melanin hyperpigmentation: Secondary | ICD-10-CM | POA: Diagnosis not present

## 2021-11-21 DIAGNOSIS — L578 Other skin changes due to chronic exposure to nonionizing radiation: Secondary | ICD-10-CM | POA: Diagnosis not present

## 2021-11-21 DIAGNOSIS — Z86018 Personal history of other benign neoplasm: Secondary | ICD-10-CM | POA: Diagnosis not present

## 2021-11-22 DIAGNOSIS — Z23 Encounter for immunization: Secondary | ICD-10-CM | POA: Diagnosis not present

## 2021-11-22 DIAGNOSIS — I10 Essential (primary) hypertension: Secondary | ICD-10-CM | POA: Diagnosis not present

## 2021-11-22 DIAGNOSIS — Z1339 Encounter for screening examination for other mental health and behavioral disorders: Secondary | ICD-10-CM | POA: Diagnosis not present

## 2021-11-22 DIAGNOSIS — R001 Bradycardia, unspecified: Secondary | ICD-10-CM | POA: Diagnosis not present

## 2021-11-22 DIAGNOSIS — I509 Heart failure, unspecified: Secondary | ICD-10-CM | POA: Diagnosis not present

## 2021-11-22 DIAGNOSIS — Z1331 Encounter for screening for depression: Secondary | ICD-10-CM | POA: Diagnosis not present

## 2021-11-22 DIAGNOSIS — Z0000 Encounter for general adult medical examination without abnormal findings: Secondary | ICD-10-CM | POA: Diagnosis not present

## 2021-12-13 DIAGNOSIS — Z23 Encounter for immunization: Secondary | ICD-10-CM | POA: Diagnosis not present

## 2022-02-21 DIAGNOSIS — R001 Bradycardia, unspecified: Secondary | ICD-10-CM | POA: Diagnosis not present

## 2022-02-21 DIAGNOSIS — I10 Essential (primary) hypertension: Secondary | ICD-10-CM | POA: Diagnosis not present

## 2022-02-21 DIAGNOSIS — J309 Allergic rhinitis, unspecified: Secondary | ICD-10-CM | POA: Diagnosis not present

## 2022-02-21 DIAGNOSIS — G4700 Insomnia, unspecified: Secondary | ICD-10-CM | POA: Diagnosis not present

## 2022-04-03 DIAGNOSIS — H2513 Age-related nuclear cataract, bilateral: Secondary | ICD-10-CM | POA: Diagnosis not present

## 2022-04-03 DIAGNOSIS — H35033 Hypertensive retinopathy, bilateral: Secondary | ICD-10-CM | POA: Diagnosis not present

## 2022-04-03 DIAGNOSIS — H353121 Nonexudative age-related macular degeneration, left eye, early dry stage: Secondary | ICD-10-CM | POA: Diagnosis not present

## 2022-05-02 DIAGNOSIS — Z125 Encounter for screening for malignant neoplasm of prostate: Secondary | ICD-10-CM | POA: Diagnosis not present

## 2022-05-02 DIAGNOSIS — I10 Essential (primary) hypertension: Secondary | ICD-10-CM | POA: Diagnosis not present

## 2022-05-02 DIAGNOSIS — I509 Heart failure, unspecified: Secondary | ICD-10-CM | POA: Diagnosis not present

## 2022-05-02 DIAGNOSIS — M109 Gout, unspecified: Secondary | ICD-10-CM | POA: Diagnosis not present

## 2022-05-07 DIAGNOSIS — G4700 Insomnia, unspecified: Secondary | ICD-10-CM | POA: Diagnosis not present

## 2022-05-07 DIAGNOSIS — F331 Major depressive disorder, recurrent, moderate: Secondary | ICD-10-CM | POA: Diagnosis not present

## 2022-05-07 DIAGNOSIS — M109 Gout, unspecified: Secondary | ICD-10-CM | POA: Diagnosis not present

## 2022-05-07 DIAGNOSIS — F411 Generalized anxiety disorder: Secondary | ICD-10-CM | POA: Diagnosis not present

## 2022-05-07 DIAGNOSIS — I509 Heart failure, unspecified: Secondary | ICD-10-CM | POA: Diagnosis not present

## 2022-05-07 DIAGNOSIS — J309 Allergic rhinitis, unspecified: Secondary | ICD-10-CM | POA: Diagnosis not present

## 2022-05-07 DIAGNOSIS — Z125 Encounter for screening for malignant neoplasm of prostate: Secondary | ICD-10-CM | POA: Diagnosis not present

## 2022-05-07 DIAGNOSIS — E782 Mixed hyperlipidemia: Secondary | ICD-10-CM | POA: Diagnosis not present

## 2022-05-07 DIAGNOSIS — I10 Essential (primary) hypertension: Secondary | ICD-10-CM | POA: Diagnosis not present

## 2022-05-18 DIAGNOSIS — H2513 Age-related nuclear cataract, bilateral: Secondary | ICD-10-CM | POA: Diagnosis not present

## 2022-05-18 DIAGNOSIS — H524 Presbyopia: Secondary | ICD-10-CM | POA: Diagnosis not present

## 2022-05-18 DIAGNOSIS — H5213 Myopia, bilateral: Secondary | ICD-10-CM | POA: Diagnosis not present

## 2022-05-18 DIAGNOSIS — H35033 Hypertensive retinopathy, bilateral: Secondary | ICD-10-CM | POA: Diagnosis not present

## 2022-05-18 DIAGNOSIS — H52223 Regular astigmatism, bilateral: Secondary | ICD-10-CM | POA: Diagnosis not present

## 2022-05-18 DIAGNOSIS — H353121 Nonexudative age-related macular degeneration, left eye, early dry stage: Secondary | ICD-10-CM | POA: Diagnosis not present

## 2022-05-23 DIAGNOSIS — D2272 Melanocytic nevi of left lower limb, including hip: Secondary | ICD-10-CM | POA: Diagnosis not present

## 2022-05-23 DIAGNOSIS — L821 Other seborrheic keratosis: Secondary | ICD-10-CM | POA: Diagnosis not present

## 2022-05-23 DIAGNOSIS — L814 Other melanin hyperpigmentation: Secondary | ICD-10-CM | POA: Diagnosis not present

## 2022-05-23 DIAGNOSIS — Z86018 Personal history of other benign neoplasm: Secondary | ICD-10-CM | POA: Diagnosis not present

## 2022-05-23 DIAGNOSIS — Z85828 Personal history of other malignant neoplasm of skin: Secondary | ICD-10-CM | POA: Diagnosis not present

## 2022-05-23 DIAGNOSIS — L570 Actinic keratosis: Secondary | ICD-10-CM | POA: Diagnosis not present

## 2022-05-23 DIAGNOSIS — D485 Neoplasm of uncertain behavior of skin: Secondary | ICD-10-CM | POA: Diagnosis not present

## 2022-05-23 DIAGNOSIS — L820 Inflamed seborrheic keratosis: Secondary | ICD-10-CM | POA: Diagnosis not present

## 2022-05-23 DIAGNOSIS — L578 Other skin changes due to chronic exposure to nonionizing radiation: Secondary | ICD-10-CM | POA: Diagnosis not present

## 2022-05-23 DIAGNOSIS — D225 Melanocytic nevi of trunk: Secondary | ICD-10-CM | POA: Diagnosis not present

## 2022-06-12 DIAGNOSIS — H2513 Age-related nuclear cataract, bilateral: Secondary | ICD-10-CM | POA: Diagnosis not present

## 2022-06-13 DIAGNOSIS — J309 Allergic rhinitis, unspecified: Secondary | ICD-10-CM | POA: Diagnosis not present

## 2022-06-13 DIAGNOSIS — F411 Generalized anxiety disorder: Secondary | ICD-10-CM | POA: Diagnosis not present

## 2022-06-13 DIAGNOSIS — I10 Essential (primary) hypertension: Secondary | ICD-10-CM | POA: Diagnosis not present

## 2022-06-13 DIAGNOSIS — F331 Major depressive disorder, recurrent, moderate: Secondary | ICD-10-CM | POA: Diagnosis not present

## 2022-06-13 DIAGNOSIS — G4700 Insomnia, unspecified: Secondary | ICD-10-CM | POA: Diagnosis not present

## 2022-07-13 DIAGNOSIS — F331 Major depressive disorder, recurrent, moderate: Secondary | ICD-10-CM | POA: Diagnosis not present

## 2022-07-13 DIAGNOSIS — I10 Essential (primary) hypertension: Secondary | ICD-10-CM | POA: Diagnosis not present

## 2022-07-13 DIAGNOSIS — G4700 Insomnia, unspecified: Secondary | ICD-10-CM | POA: Diagnosis not present

## 2022-07-13 DIAGNOSIS — F411 Generalized anxiety disorder: Secondary | ICD-10-CM | POA: Diagnosis not present

## 2022-07-17 DIAGNOSIS — H2511 Age-related nuclear cataract, right eye: Secondary | ICD-10-CM | POA: Diagnosis not present

## 2022-07-19 ENCOUNTER — Encounter: Payer: Self-pay | Admitting: Physician Assistant

## 2022-07-19 ENCOUNTER — Ambulatory Visit: Payer: PPO | Attending: Physician Assistant | Admitting: Physician Assistant

## 2022-07-19 VITALS — BP 172/78 | HR 58 | Ht 70.0 in | Wt 202.6 lb

## 2022-07-19 DIAGNOSIS — E785 Hyperlipidemia, unspecified: Secondary | ICD-10-CM | POA: Diagnosis not present

## 2022-07-19 DIAGNOSIS — G4733 Obstructive sleep apnea (adult) (pediatric): Secondary | ICD-10-CM | POA: Diagnosis not present

## 2022-07-19 DIAGNOSIS — I10 Essential (primary) hypertension: Secondary | ICD-10-CM | POA: Diagnosis not present

## 2022-07-19 DIAGNOSIS — I2510 Atherosclerotic heart disease of native coronary artery without angina pectoris: Secondary | ICD-10-CM | POA: Diagnosis not present

## 2022-07-19 DIAGNOSIS — I447 Left bundle-branch block, unspecified: Secondary | ICD-10-CM

## 2022-07-19 DIAGNOSIS — I420 Dilated cardiomyopathy: Secondary | ICD-10-CM

## 2022-07-19 DIAGNOSIS — Z720 Tobacco use: Secondary | ICD-10-CM | POA: Diagnosis not present

## 2022-07-19 MED ORDER — SACUBITRIL-VALSARTAN 97-103 MG PO TABS: 1.0000 | ORAL_TABLET | Freq: Two times a day (BID) | ORAL | 11 refills | Status: DC

## 2022-07-19 NOTE — Patient Instructions (Signed)
Medication Instructions:  1.Increase entresto to 97-103 mg twice daily *If you need a refill on your cardiac medications before your next appointment, please call your pharmacy*   Lab Work: BMET, LIPIDS, LFT'S in 2 weeks with primary care  Follow-Up: At [facility], you and your health needs are our priority.  As part of our continuing mission to provide you with exceptional heart care, we have created designated Provider Care Teams.  These Care Teams include your primary Cardiologist (physician) and Advanced Practice Providers (APPs -  Physician Assistants and Nurse Practitioners) who all work together to provide you with the care you need, when you need it.  Your next appointment:   1 year(s)  Provider:   Charlton Haws, MD    Other Instructions Check your blood pressure daily, 1 hr after morning medications for 2 weeks, keep a log and send [place] the readings through mychart at the end of the 2 weeks.   Low-Sodium Eating Plan Salt (sodium) helps you keep a healthy balance of fluids in your body. Too much sodium can raise your blood pressure. It can also cause fluid and waste to be held in your body. Your health care provider or dietitian may recommend a low-sodium eating plan if you have high blood pressure (hypertension), kidney disease, liver disease, or heart failure. Eating less sodium can help lower your blood pressure and reduce swelling. It can also protect your heart, liver, and kidneys. What are tips for following this plan? Reading food labels  Check food labels for the amount of sodium per serving. If you eat more than one serving, you must multiply the listed amount by the number of servings. Choose foods with less than 140 milligrams (mg) of sodium per serving. Avoid foods with 300 mg of sodium or more per serving. Always check how much sodium is in a product, even if the label says "unsalted" or "no salt added." Shopping  Buy products labeled as "low-sodium" or  "no salt added." Buy fresh foods. Avoid canned foods and pre-made or frozen meals. Avoid canned, cured, or processed meats. Buy breads that have less than 80 mg of sodium per slice. Cooking  Eat more home-cooked food. Try to eat less restaurant, buffet, and fast food. Try not to add salt when you cook. Use salt-free seasonings or herbs instead of table salt or sea salt. Check with your provider or pharmacist before using salt substitutes. Cook with plant-based oils, such as canola, sunflower, or olive oil. Meal planning When eating at a restaurant, ask if your food can be made with less salt or no salt. Avoid dishes labeled as brined, pickled, cured, or smoked. Avoid dishes made with soy sauce, miso, or teriyaki sauce. Avoid foods that have monosodium glutamate (MSG) in them. MSG may be added to some restaurant food, sauces, soups, bouillon, and canned foods. Make meals that can be grilled, baked, poached, roasted, or steamed. These are often made with less sodium. General information Try to limit your sodium intake to 1,500-2,300 mg each day, or the amount told by your provider. What foods should I eat? Fruits Fresh, frozen, or canned fruit. Fruit juice. Vegetables Fresh or frozen vegetables. "No salt added" canned vegetables. "No salt added" tomato sauce and paste. Low-sodium or reduced-sodium tomato and vegetable juice. Grains Low-sodium cereals, such as oats, puffed wheat and rice, and shredded wheat. Low-sodium crackers. Unsalted rice. Unsalted pasta. Low-sodium bread. Whole grain breads and whole grain pasta. Meats and other proteins Fresh or frozen meat, poultry,  seafood, and fish. These should have no added salt. Low-sodium canned tuna and salmon. Unsalted nuts. Dried peas, beans, and lentils without added salt. Unsalted canned beans. Eggs. Unsalted nut butters. Dairy Milk. Soy milk. Cheese that is naturally low in sodium, such as ricotta cheese, fresh mozzarella, or Swiss cheese.  Low-sodium or reduced-sodium cheese. Cream cheese. Yogurt. Seasonings and condiments Fresh and dried herbs and spices. Salt-free seasonings. Low-sodium mustard and ketchup. Sodium-free salad dressing. Sodium-free light mayonnaise. Fresh or refrigerated horseradish. Lemon juice. Vinegar. Other foods Homemade, reduced-sodium, or low-sodium soups. Unsalted popcorn and pretzels. Low-salt or salt-free chips. The items listed above may not be all the foods and drinks you can have. Talk to a dietitian to learn more. What foods should I avoid? Vegetables Sauerkraut, pickled vegetables, and relishes. Olives. [place] fries. Onion rings. Regular canned vegetables, except low-sodium or reduced-sodium items. Regular canned tomato sauce and paste. Regular tomato and vegetable juice. Frozen vegetables in sauces. Grains Instant hot cereals. Bread stuffing, pancake, and biscuit mixes. Croutons. Seasoned rice or pasta mixes. Noodle soup cups. Boxed or frozen macaroni and cheese. Regular salted crackers. Self-rising flour. Meats and other proteins Meat or fish that is salted, canned, smoked, spiced, or pickled. Precooked or cured meat, such as sausages or meat loaves. Tomasa Blase. Ham. Pepperoni. Hot dogs. Corned beef. Chipped beef. Salt pork. Jerky. Pickled herring, anchovies, and sardines. Regular canned tuna. Salted nuts. Dairy Processed cheese and cheese spreads. Hard cheeses. Cheese curds. Blue cheese. Feta cheese. String cheese. Regular cottage cheese. Buttermilk. Canned milk. Fats and oils Salted butter. Regular margarine. Ghee. Bacon fat. Seasonings and condiments Onion salt, garlic salt, seasoned salt, table salt, and sea salt. Canned and packaged gravies. Worcestershire sauce. Tartar sauce. Barbecue sauce. Teriyaki sauce. Soy sauce, including reduced-sodium soy sauce. Steak sauce. Fish sauce. Oyster sauce. Cocktail sauce. Horseradish that you find on the shelf. Regular ketchup and mustard. Meat flavorings and  tenderizers. Bouillon cubes. Hot sauce. Pre-made or packaged marinades. Pre-made or packaged taco seasonings. Relishes. Regular salad dressings. Salsa. Other foods Salted popcorn and pretzels. Corn chips and puffs. Potato and tortilla chips. Canned or dried soups. Pizza. Frozen entrees and pot pies. The items listed above may not be all the foods and drinks you should avoid. Talk to a dietitian to learn more. This information is not intended to replace advice given to you by your health care provider. Make sure you discuss any questions you have with your health care provider. Document Revised: 02/01/2022 Document Reviewed: 02/01/2022 Elsevier Patient Education  2024 [employer] Inc.   Heart-Healthy Eating Plan Many factors influence your heart health, including eating and exercise habits. Heart health is also called coronary health. Coronary risk increases with abnormal blood fat (lipid) levels. A heart-healthy eating plan includes limiting unhealthy fats, increasing healthy fats, limiting salt (sodium) intake, and making other diet and lifestyle changes. What is my plan? Your health care provider may recommend that: You limit your fat intake to _________% or less of your total calories each day. You limit your saturated fat intake to _________% or less of your total calories each day. You limit the amount of cholesterol in your diet to less than _________ mg per day. You limit the amount of sodium in your diet to less than _________ mg per day. What are tips for following this plan? Cooking Cook foods using methods other than frying. Baking, boiling, grilling, and broiling are all good options. Other ways to reduce fat include: Removing the skin from poultry. Removing all visible  fats from meats. Steaming vegetables in water or broth. Meal planning  At meals, imagine dividing your plate into fourths: Fill one-half of your plate with vegetables and green salads. Fill one-fourth of your plate  with whole grains. Fill one-fourth of your plate with lean protein foods. Eat 2-4 cups of vegetables per day. One cup of vegetables equals 1 cup (91 g) broccoli or cauliflower florets, 2 medium carrots, 1 large bell pepper, 1 large sweet potato, 1 large tomato, 1 medium white potato, 2 cups (150 g) raw leafy greens. Eat 1-2 cups of fruit per day. One cup of fruit equals 1 small apple, 1 large banana, 1 cup (237 g) mixed fruit, 1 large orange,  cup (82 g) dried fruit, 1 cup (240 mL) 100% fruit juice. Eat more foods that contain soluble fiber. Examples include apples, broccoli, carrots, beans, peas, and barley. Aim to get 25-30 g of fiber per day. Increase your consumption of legumes, nuts, and seeds to 4-5 servings per week. One serving of dried beans or legumes equals  cup (90 g) cooked, 1 serving of nuts is  oz (12 almonds, 24 pistachios, or 7 walnut halves), and 1 serving of seeds equals  oz (8 g). Fats Choose healthy fats more often. Choose monounsaturated and polyunsaturated fats, such as olive and canola oils, avocado oil, flaxseeds, walnuts, almonds, and seeds. Eat more omega-3 fats. Choose salmon, mackerel, sardines, tuna, flaxseed oil, and ground flaxseeds. Aim to eat fish at least 2 times each week. Check food labels carefully to identify foods with trans fats or high amounts of saturated fat. Limit saturated fats. These are found in animal products, such as meats, butter, and cream. Plant sources of saturated fats include palm oil, palm kernel oil, and coconut oil. Avoid foods with partially hydrogenated oils in them. These contain trans fats. Examples are stick margarine, some tub margarines, cookies, crackers, and other baked goods. Avoid fried foods. General information Eat more home-cooked food and less restaurant, buffet, and fast food. Limit or avoid alcohol. Limit foods that are high in added sugar and simple starches such as foods made using white refined flour (white breads,  pastries, sweets). Lose weight if you are overweight. Losing just 5-10% of your body weight can help your overall health and prevent diseases such as diabetes and heart disease. Monitor your sodium intake, especially if you have high blood pressure. Talk with your health care provider about your sodium intake. Try to incorporate more vegetarian meals weekly. What foods should I eat? Fruits All fresh, canned (in natural juice), or frozen fruits. Vegetables Fresh or frozen vegetables (raw, steamed, roasted, or grilled). Green salads. Grains Most grains. Choose whole wheat and whole grains most of the time. Rice and pasta, including brown rice and pastas made with whole wheat. Meats and other proteins Lean, well-trimmed beef, veal, pork, and lamb. Chicken and [place] without skin. All fish and shellfish. Wild duck, rabbit, pheasant, and venison. Egg whites or low-cholesterol egg substitutes. Dried beans, peas, lentils, and tofu. Seeds and most nuts. Dairy Low-fat or nonfat cheeses, including ricotta and mozzarella. Skim or 1% milk (liquid, powdered, or evaporated). Buttermilk made with low-fat milk. Nonfat or low-fat yogurt. Fats and oils Non-hydrogenated (trans-free) margarines. Vegetable oils, including soybean, sesame, sunflower, olive, avocado, peanut, safflower, corn, canola, and cottonseed. Salad dressings or mayonnaise made with a vegetable oil. Beverages Water (mineral or sparkling). Coffee and tea. Unsweetened ice tea. Diet beverages. Sweets and desserts Sherbet, gelatin, and fruit ice. Small amounts of dark  chocolate. Limit all sweets and desserts. Seasonings and condiments All seasonings and condiments. The items listed above may not be a complete list of foods and beverages you can eat. Contact a dietitian for more options. What foods should I avoid? Fruits Canned fruit in heavy syrup. Fruit in cream or butter sauce. Fried fruit. Limit coconut. Vegetables Vegetables cooked in  cheese, cream, or butter sauce. Fried vegetables. Grains Breads made with saturated or trans fats, oils, or whole milk. Croissants. Sweet rolls. Donuts. High-fat crackers, such as cheese crackers and chips. Meats and other proteins Fatty meats, such as hot dogs, ribs, sausage, bacon, rib-eye roast or steak. High-fat deli meats, such as salami and bologna. Caviar. Domestic duck and goose. Organ meats, such as liver. Dairy Cream, sour cream, cream cheese, and creamed cottage cheese. Whole-milk cheeses. Whole or 2% milk (liquid, evaporated, or condensed). Whole buttermilk. Cream sauce or high-fat cheese sauce. Whole-milk yogurt. Fats and oils Meat fat, or shortening. Cocoa butter, hydrogenated oils, palm oil, coconut oil, palm kernel oil. Solid fats and shortenings, including bacon fat, salt pork, lard, and butter. Nondairy cream substitutes. Salad dressings with cheese or sour cream. Beverages Regular sodas and any drinks with added sugar. Sweets and desserts Frosting. Pudding. Cookies. Cakes. Pies. Milk chocolate or white chocolate. Buttered syrups. Full-fat ice cream or ice cream drinks. The items listed above may not be a complete list of foods and beverages to avoid. Contact a dietitian for more information. Summary Heart-healthy meal planning includes limiting unhealthy fats, increasing healthy fats, limiting salt (sodium) intake and making other diet and lifestyle changes. Lose weight if you are overweight. Losing just 5-10% of your body weight can help your overall health and prevent diseases such as diabetes and heart disease. Focus on eating a balance of foods, including fruits and vegetables, low-fat or nonfat dairy, lean protein, nuts and legumes, whole grains, and heart-healthy oils and fats. This information is not intended to replace advice given to you by your health care provider. Make sure you discuss any questions you have with your health care provider. Document Revised:  02/20/2021 Document Reviewed: 02/20/2021 Elsevier Patient Education  2024 [employer].

## 2022-07-19 NOTE — Progress Notes (Signed)
Cardiology Office Note:  .   Date:  07/19/2022  ID:  Frank Foster, DOB [birth year], MRN [id number] PCP: Lewis Moccasin, MD  [facility] Providers Cardiologist:  Charlton Haws, MD {  History of Present Illness: .   Frank Foster is a 77 y.o. male with a past medical history of CAD and dilated cardiomyopathy,'s former smoker quitting in 2015, hyperlipidemia who presents today for follow-up visit.  Had a screening on his carotid and calcium score done 10/17/2017.  Carotids with mild plaque, no stenosis.  Calcium score with mild three-vessel CAD score 205.  Incorrectly labeled as 85th percentile but was only 54th percentile for age and sex matched controls.  No cardiac symptoms at that time.  Was not having any chest pain but was somewhat sedentary walked 3 times a week and golfed a bit.  Long discussion with him about EKG showing LBBB.  No palpitations or syncope.  No exertional dyspnea.  Still working in finances.  TTE done 03/02/2020 with EF 40%.  Myoview 01/15/2018 with anterior septal, apical anterior defect with fixed EF 36%.  Cardiac catheterization 01/23/2021 with Dr. Clifton James showed no CAD and normal filling pressures.  Mean PCWP 8 mmHg.  Started on [employer].  On statin for HLD.  He had a lap chole for acute cholecystitis by Dr. Daphine Deutscher 08/28/2019.  He was compliant with his medications and functional class I for heart failure.  Walks over 10 miles a week when he was last seen by Dr. Eden Emms 05/29/2021.  Today, he tells me that he tolerated well.  No chest pain or shortness of breath.  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  He has two sons and 5 grandkids. They all live in walking distance.  Blood pressure elevated in clinic and elevated at home 140s systolic. He is unsure if his BP cuff is accurate. I retook his blood pressure and it was 152/70 today.  Reports no shortness of breath nor dyspnea on exertion. Reports no chest pain, pressure, or tightness. No  edema, orthopnea, PND. Reports no palpitations.    ROS: Pertinent ROS in HPI  Studies Reviewed: Marland Kitchen        EKG reviewed with stable LBBB  Physical Exam:   VS:  There were no vitals taken for this visit.   Wt Readings from Last 3 Encounters:  05/29/21 208 lb (94.3 kg)  12/02/20 190 lb (86.2 kg)  03/01/20 196 lb (88.9 kg)    GEN: Well nourished, well developed in no acute distress NECK: No JVD; No carotid bruits CARDIAC: RRR, no murmurs, rubs, gallops RESPIRATORY:  Clear to auscultation without rales, wheezing or rhonchi  ABDOMEN: Soft, non-tender, non-distended EXTREMITIES:  No edema; No deformity   ASSESSMENT AND PLAN: .   1.  CAD -Continue current medications which include aspirin daily, take Crestor 5 mg daily, chest (increased to 97-130 mg twice a day -No chest pain or shortness of breath -Continue heart healthy/low-sodium diet  2.  Hypertension -140/70s at home per report -Blood pressure cuff is older and suggested getting a new one -Increase Entresto today -Continue to monitor blood pressure an hour after morning medications -BMP in two weeks  3.  Hyperlipidemia -Will need updated lipid panel and LFTs. -For now, continue Crestor 5 daily  4.  Smoking, quit 2015  5.  LBBB -This has been stable over the years, EKG reviewed  6.  DCM -no swelling  -Continue current medication regimen as listed above -Continue low-sodium  diet  7.  OSA, no CPAP  -sleeps well with no further indication for workup       Dispo: Can follow-up with Dr. Eden Emms in 1 year.  Signed, Sharlene Dory, PA-C

## 2022-07-23 DIAGNOSIS — H2512 Age-related nuclear cataract, left eye: Secondary | ICD-10-CM | POA: Diagnosis not present

## 2022-07-31 DIAGNOSIS — L089 Local infection of the skin and subcutaneous tissue, unspecified: Secondary | ICD-10-CM | POA: Diagnosis not present

## 2022-08-10 DIAGNOSIS — I10 Essential (primary) hypertension: Secondary | ICD-10-CM | POA: Diagnosis not present

## 2022-08-10 DIAGNOSIS — F411 Generalized anxiety disorder: Secondary | ICD-10-CM | POA: Diagnosis not present

## 2022-08-10 DIAGNOSIS — G4700 Insomnia, unspecified: Secondary | ICD-10-CM | POA: Diagnosis not present

## 2022-08-10 DIAGNOSIS — F331 Major depressive disorder, recurrent, moderate: Secondary | ICD-10-CM | POA: Diagnosis not present

## 2022-08-14 DIAGNOSIS — H2512 Age-related nuclear cataract, left eye: Secondary | ICD-10-CM | POA: Diagnosis not present

## 2022-09-11 ENCOUNTER — Other Ambulatory Visit: Payer: Self-pay

## 2022-09-11 MED ORDER — SACUBITRIL-VALSARTAN 97-103 MG PO TABS: 1.0000 | ORAL_TABLET | Freq: Two times a day (BID) | ORAL | 3 refills | Status: DC

## 2022-11-06 DIAGNOSIS — E559 Vitamin D deficiency, unspecified: Secondary | ICD-10-CM | POA: Diagnosis not present

## 2022-11-06 DIAGNOSIS — N529 Male erectile dysfunction, unspecified: Secondary | ICD-10-CM | POA: Diagnosis not present

## 2022-11-06 DIAGNOSIS — R5383 Other fatigue: Secondary | ICD-10-CM | POA: Diagnosis not present

## 2022-11-06 DIAGNOSIS — E785 Hyperlipidemia, unspecified: Secondary | ICD-10-CM | POA: Diagnosis not present

## 2022-11-06 DIAGNOSIS — I10 Essential (primary) hypertension: Secondary | ICD-10-CM | POA: Diagnosis not present

## 2022-11-06 DIAGNOSIS — R739 Hyperglycemia, unspecified: Secondary | ICD-10-CM | POA: Diagnosis not present

## 2022-11-06 DIAGNOSIS — Z125 Encounter for screening for malignant neoplasm of prostate: Secondary | ICD-10-CM | POA: Diagnosis not present

## 2022-11-08 DIAGNOSIS — Z23 Encounter for immunization: Secondary | ICD-10-CM | POA: Diagnosis not present

## 2022-11-08 DIAGNOSIS — E782 Mixed hyperlipidemia: Secondary | ICD-10-CM | POA: Diagnosis not present

## 2022-11-08 DIAGNOSIS — R7303 Prediabetes: Secondary | ICD-10-CM | POA: Diagnosis not present

## 2022-11-08 DIAGNOSIS — I10 Essential (primary) hypertension: Secondary | ICD-10-CM | POA: Diagnosis not present

## 2022-11-08 DIAGNOSIS — E559 Vitamin D deficiency, unspecified: Secondary | ICD-10-CM | POA: Diagnosis not present

## 2022-11-08 DIAGNOSIS — Z789 Other specified health status: Secondary | ICD-10-CM | POA: Diagnosis not present

## 2022-11-12 DIAGNOSIS — I10 Essential (primary) hypertension: Secondary | ICD-10-CM | POA: Diagnosis not present

## 2022-11-12 DIAGNOSIS — Z125 Encounter for screening for malignant neoplasm of prostate: Secondary | ICD-10-CM | POA: Diagnosis not present

## 2022-11-12 DIAGNOSIS — E782 Mixed hyperlipidemia: Secondary | ICD-10-CM | POA: Diagnosis not present

## 2022-11-12 DIAGNOSIS — N529 Male erectile dysfunction, unspecified: Secondary | ICD-10-CM | POA: Diagnosis not present

## 2022-11-12 DIAGNOSIS — Z7185 Encounter for immunization safety counseling: Secondary | ICD-10-CM | POA: Diagnosis not present

## 2022-11-16 ENCOUNTER — Emergency Department (HOSPITAL_BASED_OUTPATIENT_CLINIC_OR_DEPARTMENT_OTHER): Payer: PPO

## 2022-11-16 ENCOUNTER — Encounter (HOSPITAL_BASED_OUTPATIENT_CLINIC_OR_DEPARTMENT_OTHER): Payer: Self-pay

## 2022-11-16 ENCOUNTER — Other Ambulatory Visit: Payer: Self-pay

## 2022-11-16 ENCOUNTER — Observation Stay (HOSPITAL_BASED_OUTPATIENT_CLINIC_OR_DEPARTMENT_OTHER)
Admission: EM | Admit: 2022-11-16 | Discharge: 2022-11-17 | Disposition: A | Discharge status: Home / Self Care | Payer: PPO | Attending: Internal Medicine | Admitting: Internal Medicine

## 2022-11-16 DIAGNOSIS — I77819 Aortic ectasia, unspecified site: Secondary | ICD-10-CM | POA: Insufficient documentation

## 2022-11-16 DIAGNOSIS — G459 Transient cerebral ischemic attack, unspecified: Secondary | ICD-10-CM | POA: Diagnosis not present

## 2022-11-16 DIAGNOSIS — Z79899 Other long term (current) drug therapy: Secondary | ICD-10-CM | POA: Insufficient documentation

## 2022-11-16 DIAGNOSIS — R4701 Aphasia: Secondary | ICD-10-CM | POA: Diagnosis present

## 2022-11-16 DIAGNOSIS — R7303 Prediabetes: Secondary | ICD-10-CM | POA: Diagnosis not present

## 2022-11-16 DIAGNOSIS — I110 Hypertensive heart disease with heart failure: Secondary | ICD-10-CM | POA: Insufficient documentation

## 2022-11-16 DIAGNOSIS — R29818 Other symptoms and signs involving the nervous system: Secondary | ICD-10-CM | POA: Diagnosis not present

## 2022-11-16 DIAGNOSIS — I5032 Chronic diastolic (congestive) heart failure: Secondary | ICD-10-CM | POA: Diagnosis not present

## 2022-11-16 DIAGNOSIS — E041 Nontoxic single thyroid nodule: Secondary | ICD-10-CM | POA: Diagnosis not present

## 2022-11-16 DIAGNOSIS — Z87891 Personal history of nicotine dependence: Secondary | ICD-10-CM | POA: Insufficient documentation

## 2022-11-16 DIAGNOSIS — Z7982 Long term (current) use of aspirin: Secondary | ICD-10-CM | POA: Diagnosis not present

## 2022-11-16 DIAGNOSIS — R269 Unspecified abnormalities of gait and mobility: Secondary | ICD-10-CM | POA: Diagnosis not present

## 2022-11-16 DIAGNOSIS — R4781 Slurred speech: Secondary | ICD-10-CM | POA: Diagnosis not present

## 2022-11-16 LAB — DIFFERENTIAL
Abs Immature Granulocytes: 0.02 10*3/uL (ref 0.00–0.07)
Basophils Absolute: 0 10*3/uL (ref 0.0–0.1)
Basophils Relative: 1 %
Eosinophils Absolute: 0.1 10*3/uL (ref 0.0–0.5)
Eosinophils Relative: 1 %
Immature Granulocytes: 0 %
Lymphocytes Relative: 24 %
Lymphs Abs: 1.4 10*3/uL (ref 0.7–4.0)
Monocytes Absolute: 0.6 10*3/uL (ref 0.1–1.0)
Monocytes Relative: 10 %
Neutro Abs: 3.6 10*3/uL (ref 1.7–7.7)
Neutrophils Relative %: 64 %

## 2022-11-16 LAB — COMPREHENSIVE METABOLIC PANEL
ALT: 18 U/L (ref 0–44)
AST: 15 U/L (ref 15–41)
Albumin: 4 g/dL (ref 3.5–5.0)
Alkaline Phosphatase: 64 U/L (ref 38–126)
Anion gap: 7 (ref 5–15)
BUN: 17 mg/dL (ref 8–23)
CO2: 31 mmol/L (ref 22–32)
Calcium: 9.6 mg/dL (ref 8.9–10.3)
Chloride: 103 mmol/L (ref 98–111)
Creatinine, Ser: 1.11 mg/dL (ref 0.61–1.24)
GFR, Estimated: >60 mL/min (ref >60)
Glucose, Bld: 126 mg/dL — ABNORMAL HIGH (ref 70–99)
Potassium: 4.5 mmol/L (ref 3.5–5.1)
Sodium: 141 mmol/L (ref 135–145)
Total Bilirubin: 0.6 mg/dL (ref 0.3–1.2)
Total Protein: 7.1 g/dL (ref 6.5–8.1)

## 2022-11-16 LAB — CBC
HCT: 46.1 % (ref 39.0–52.0)
Hemoglobin: 15.5 g/dL (ref 13.0–17.0)
MCH: 32.6 pg (ref 26.0–34.0)
MCHC: 33.6 g/dL (ref 30.0–36.0)
MCV: 97.1 fL (ref 80.0–100.0)
Platelets: 179 10*3/uL (ref 150–400)
RBC: 4.75 MIL/uL (ref 4.22–5.81)
RDW: 12.9 % (ref 11.5–15.5)
WBC: 5.7 10*3/uL (ref 4.0–10.5)
nRBC: 0 % (ref 0.0–0.2)

## 2022-11-16 LAB — PROTIME-INR
INR: 0.9 (ref 0.8–1.2)
Prothrombin Time: 12.4 s (ref 11.4–15.2)

## 2022-11-16 LAB — ETHANOL: Alcohol, Ethyl (B): <10 mg/dL (ref <10)

## 2022-11-16 LAB — CBG MONITORING, ED: Glucose-Capillary: 110 mg/dL — ABNORMAL HIGH (ref 70–99)

## 2022-11-16 LAB — APTT: aPTT: 30 s (ref 24–36)

## 2022-11-16 MED ORDER — ALLOPURINOL 100 MG PO TABS: 100.0000 mg | ORAL_TABLET | Freq: Every evening | ORAL | Status: DC

## 2022-11-16 MED ORDER — LABETALOL HCL 5 MG/ML IV SOLN: 5.0000 mg | INTRAVENOUS | Status: DC | PRN

## 2022-11-16 MED ORDER — ASPIRIN 81 MG PO TBEC
81.0000 mg | DELAYED_RELEASE_TABLET | Freq: Every evening | ORAL | Status: DC
  Administered 2022-11-16: 81 mg via ORAL
  Filled 2022-11-16: qty 1

## 2022-11-16 MED ORDER — ROSUVASTATIN CALCIUM 5 MG PO TABS
5.0000 mg | ORAL_TABLET | Freq: Every evening | ORAL | Status: DC
  Administered 2022-11-16: 5 mg via ORAL
  Filled 2022-11-16: qty 1

## 2022-11-16 MED ORDER — SERTRALINE HCL 50 MG PO TABS
25.0000 mg | ORAL_TABLET | Freq: Every day | ORAL | Status: DC
  Administered 2022-11-17: 25 mg via ORAL
  Filled 2022-11-16: qty 1

## 2022-11-16 MED ORDER — VITAMIN D 25 MCG (1000 UNIT) PO TABS
1000.0000 [IU] | ORAL_TABLET | Freq: Every day | ORAL | Status: DC
  Administered 2022-11-17: 1000 [IU] via ORAL
  Filled 2022-11-16: qty 1

## 2022-11-16 MED ORDER — ZOLPIDEM TARTRATE 5 MG PO TABS
5.0000 mg | ORAL_TABLET | Freq: Every day | ORAL | Status: DC
  Administered 2022-11-16: 5 mg via ORAL
  Filled 2022-11-16: qty 1

## 2022-11-16 MED ORDER — SODIUM CHLORIDE 0.9% FLUSH: 3.0000 mL | Freq: Once | INTRAVENOUS | Status: DC

## 2022-11-16 NOTE — Consult Note (Signed)
Code stroke activated at 0931 by phone call from RN as there was another code stroke in process and the cart was being used there. Pt in CT at this time per report.   Cart taken to pt at 0940. Dr Otelia Limes already on cart from previous code stroke pt.   Dr Otelia Limes began neuro exam at [phone]. mRS 0.   Derrek Monaco, telestroke RN

## 2022-11-16 NOTE — ED Notes (Signed)
Contacted Stroke team by phone at [phone], Spoke with Alissa?/Amy. Stroke cart is in use in another room at this time. They will notify when to move the cart. Patient currently in CT.

## 2022-11-16 NOTE — ED Triage Notes (Signed)
Pt presents with wife pov with complaints of slurred speech, difficulty finding his words and abnormal gait. LKW is [id number].

## 2022-11-16 NOTE — Consult Note (Signed)
[facility] TeleNeurology Consult Services    Date of Service:  11/16/2022      Metrics: Last Known Well: 6:20 AM Symptoms: As per HPI.  Patient is not a candidate for thrombolytic. NIHSS 0.   Location of the provider: [facility]  Location of the patient: [facility] Pre-Morbid Modified Rankin Scale: 0 Time Code Stroke Page received:  No page, directly notified by RN at 9:38 AM Time neurologist arrived:  9:40 AM   This consult was provided via telemedicine with 2-way video and audio communication. The patient/family was informed that care would be provided in this way and agreed to receive care in this manner.   ED Physician notified of diagnostic impression and management plan.   Assessment: 77 year old male presenting with slurred speech and word finding difficulty  - Exam reveals no focal deficits. Speech is normal. NIHSS 0.  - Symptoms have improved, but those he describes from earlier this morning are suggestive of a left MCA territory TIA.  - CT head: No evidence of acute intracranial abnormality. ASPECTS of 10.  - EKG: Sinus rhythm; Left bundle branch block - Overall impression: TIA versus small stroke versus toxic/metabolic encephalopathy     Recommendations: - Continue home ASA and rosuvasatin - Send to [facility] for MRI brain and TIA work up. - TTE - CTA of head and neck - HgbA1c, fasting lipid panel - PT consult, OT consult, Speech consult - Telemetry monitoring - Frequent neuro checks - NPO until passes stroke swallow screen      ------------------------------------------------------------------------------   History of Present Illness: 77 year old male presenting to the [facility] ED with acute onset of slurred speech, word-finding deficit and abnormal gait. LKN was 0620 after waking up. Shortly afterwards, at about 6:35-6:40 AM, he had onset of the above symptoms while in the shower. He had difficulty reaching for soap and shampoo  with his RUE. He was feeling clumsy with his right arm and had trouble "figuring out how to use it". When walking, "I could not walk straight". He was slurring his words. He also seemed very drowsy, falling asleep while his wife was speaking with him, then with slurred speech, difficulty with word retrieval and skipping words while talking to his wife. CBG 110 on arrival to the ED. BP 164/76 >> 148/73.   Of note, he had an MRI brain yesterday at [facility] as part of an Alzheimer's disease research study.      Past Medical History: Past Medical History:  Diagnosis Date   Body mass index (bmi) 28.0-28.9, adult    Gout    Hyperlipidemia    Hypertension       Past Surgical History: Past Surgical History:  Procedure Laterality Date   broken nose     CHOLECYSTECTOMY N/A 08/28/2019   Procedure: LAPAROSCOPIC CHOLECYSTECTOMY WITH INTRAOPERATIVE CHOLANGIOGRAM;  Surgeon: Luretha Murphy, MD;  Location: [facility] ORS;  Service: General;  Laterality: N/A;   KNEE SURGERY     arthroscopic right knee   RIGHT/LEFT HEART CATH AND CORONARY ANGIOGRAPHY N/A 01/23/2018   Procedure: RIGHT/LEFT HEART CATH AND CORONARY ANGIOGRAPHY;  Surgeon: Kathleene Hazel, MD;  Location: [facility] INVASIVE CV LAB;  Service: Cardiovascular;  Laterality: N/A;       Medications:  No current facility-administered medications on file prior to encounter.   Current Outpatient Medications on File Prior to Encounter  Medication Sig Dispense Refill   acetaminophen (TYLENOL) 325 MG tablet Take 650 mg by mouth every 6 (six) hours  as needed for mild pain or headache.     allopurinol (ZYLOPRIM) 100 MG tablet Take 100 mg by mouth every evening.      aspirin EC 81 MG tablet Take 81 mg by mouth every evening.     carvedilol (COREG) 3.125 MG tablet Take 1 tablet (3.125 mg total) by mouth 2 (two) times daily. 180 tablet 3   Cholecalciferol (VITAMIN D-3 PO) Take 1 capsule by mouth daily at 6 (six) AM.     rosuvastatin (CRESTOR) 5 MG tablet  Take 5 mg by mouth every evening.      sacubitril-valsartan (ENTRESTO) 97-103 MG Take 1 tablet by mouth 2 (two) times daily. 180 tablet 3   sildenafil (VIAGRA) 50 MG tablet Take 50-100 mg by mouth daily as needed for erectile dysfunction.     zolpidem (AMBIEN) 5 MG tablet Take 5 mg by mouth at bedtime.           Social History: Drug Use: None EtOH: Approximately 5 drinks per week   Family History:  Reviewed in Epic   ROS: As per HPI    Anticoagulant use:  None   Antiplatelet use: ASA   Examination:    BP (!) 141/81   Pulse 62   Resp 16   Ht 5\' 10"  (1.778 m)   Wt 93.5 kg   SpO2 96%   BMI 29.57 kg/m     1A: Level of Consciousness - 0 1B: Ask Month and Age - 0 1C: Blink Eyes & Squeeze Hands - 0 2: Test Horizontal Extraocular Movements - 0 3: Test Visual Fields - 0 4: Test Facial Palsy (Use Grimace if Obtunded) - 0 5A: Test Left Arm Motor Drift - 0 5B: Test Right Arm Motor Drift - 0 6A: Test Left Leg Motor Drift - 0 6B: Test Right Leg Motor Drift - 0 7: Test Limb Ataxia (FNF/Heel-Shin) - 0 8: Test Sensation -  0 9: Test Language/Aphasia - 0 10: Test Dysarthria - Severe Dysarthria: 0 11: Test Extinction/Inattention - Extinction to bilateral simultaneous stimulation 0   NIHSS Score: 0     Patient/Family was informed the Neurology Consult would occur via TeleHealth consult by way of interactive audio and video telecommunications and consented to receiving care in this manner.   Patient is being evaluated for possible acute neurologic impairment and high pretest probability of imminent or life-threatening deterioration. I spent total of 40 minutes providing care to this patient, including time for face to face visit via telemedicine, review of medical records, imaging studies and discussion of findings with providers, the patient and/or family.   Electronically signed: Dr. Caryl Pina

## 2022-11-16 NOTE — ED Provider Notes (Signed)
Frank [facility] AT [facility] Provider Note   CSN: [id number] Arrival date & time: 11/16/22  6962     History  Chief Complaint  Patient presents with   Aphasia    Frank Foster is a 77 y.o. male who presents emergency [facility] brought in by his wife for word finding difficulty, confusion, lethargy and ataxia.  He was given by the patient who has some difficulty with providing HPI as well as patient's wife at bedside.  Patient acknowledges feeling normal waking up this morning however got in the shower around 6:20 AM.  At that time he began having difficulty standing upright and has been feeling "off."  Wife reports that she was out walking when she came home she noticed that he was having trouble walking and was unable to say words correctly.  She states that she had an appointment this morning at her doctor and brought him with her in the car, asked for one of the PAs to see him who immediately told her to come to [facility] for evaluation of potential stroke. Patient still having difficulty walking and word finding difficulty at this time.  HPI     Home Medications Prior to Admission medications   Medication Sig Start Date End Date Taking? Authorizing Provider  acetaminophen (TYLENOL) 325 MG tablet Take 650 mg by mouth every 6 (six) hours as needed for mild pain or headache.    [provider]  allopurinol (ZYLOPRIM) 100 MG tablet Take 100 mg by mouth every evening.     [provider]  aspirin EC 81 MG tablet Take 81 mg by mouth every evening.    [provider]  carvedilol (COREG) 3.125 MG tablet Take 1 tablet (3.125 mg total) by mouth 2 (two) times daily. 01/15/18   Wendall Stade, MD  Cholecalciferol (VITAMIN D-3 PO) Take 1 capsule by mouth daily at 6 (six) AM.    [provider]  rosuvastatin (CRESTOR) 5 MG tablet Take 5 mg by mouth every evening.     [provider]   sacubitril-valsartan (ENTRESTO) 97-103 MG Take 1 tablet by mouth 2 (two) times daily. 09/11/22   Wendall Stade, MD  sildenafil (VIAGRA) 50 MG tablet Take 50-100 mg by mouth daily as needed for erectile dysfunction. 05/07/22   [provider]  zolpidem (AMBIEN) 5 MG tablet Take 5 mg by mouth at bedtime. 08/14/19   [provider]      Allergies    Penicillins    Review of Systems   Review of Systems  Physical Exam Updated Vital Signs BP (!) 164/76   Pulse 63   Resp 18   Ht 5\' 10"  (1.778 m)   Wt 93.5 kg   SpO2 97%   BMI 29.57 kg/m  Physical Exam Vitals and nursing note reviewed.  Constitutional:      Appearance: He is obese. He is not ill-appearing or toxic-appearing.  HENT:     Head: Normocephalic and atraumatic.     Mouth/Throat:     Mouth: Mucous membranes are moist.  Eyes:     Extraocular Movements: Extraocular movements intact.     Conjunctiva/sclera: Conjunctivae normal.     Pupils: Pupils are equal, round, and reactive to light.  Cardiovascular:     Rate and Rhythm: Normal rate and regular rhythm.     Pulses: Normal pulses.          Radial pulses are 2+ on the right side and 2+ on  the left side.       Dorsalis pedis pulses are 2+ on the right side and 2+ on the left side.  Pulmonary:     Effort: Pulmonary effort is normal.     Breath sounds: Normal breath sounds.  Abdominal:     General: Abdomen is protuberant.     Tenderness: There is no abdominal tenderness.  Musculoskeletal:     Cervical back: Normal range of motion and neck supple.     Right lower leg: No edema.  Neurological:     Mental Status: He is lethargic.     GCS: GCS eye subscore is 4. GCS verbal subscore is 4. GCS motor subscore is 6.     Cranial Nerves: Cranial nerves 2-12 are intact.     Sensory: Sensation is intact.     Motor: Tremor present. No weakness, atrophy, abnormal muscle tone or pronator drift.     Coordination: Finger-Nose-Finger Test normal.     Gait: Gait  abnormal.     Deep Tendon Reflexes:     Reflex Scores:      Patellar reflexes are 2+ on the right side and 2+ on the left side.    ED Results / Procedures / Treatments   Labs (all labs ordered are listed, but only abnormal results are displayed) Labs Reviewed  CBG MONITORING, ED - Abnormal; Notable for the following components:      Result Value   Glucose-Capillary 110 (*)    All other components within normal limits  PROTIME-INR  APTT  CBC  DIFFERENTIAL  COMPREHENSIVE METABOLIC PANEL  ETHANOL  URINALYSIS, W/ REFLEX TO CULTURE (INFECTION SUSPECTED)    EKG EKG Interpretation Date/Time:  Friday November 16 2022 09:27:29 EDT Ventricular Rate:  63 PR Interval:  186 QRS Duration:  151 QT Interval:  471 QTC Calculation: 483 R Axis:   19  Text Interpretation: Sinus rhythm Left bundle branch block Confirmed by Alvester Chou [phone]) on 11/16/2022 10:00:58 AM  Radiology No results found.  Procedures Procedures    Medications Ordered in ED Medications  sodium chloride flush (NS) 0.9 % injection 3 mL (has no administration in time range)    ED Course/ Medical Decision Making/ A&P Clinical Course as of 11/16/22 1113  Fri Nov 16, 2022  7829 Code stroke initiated based on LKW of 6:20 AM this morning. [AH]  [phone] Patient here with word finding difficulty, confusion, lethargy, ataxia.  Differential diagnosis includes acute stroke, encephalopathy, late effects of hypnotic sleep sedative, medication overdose, metabolic causes including infection, hyperammonemia, etc.  Currently awaiting labs.  Code stroke initiated. [AH]  1029 This is a 77 year old gentleman who presented from home in the company of his wife with concern for word finding difficulty and abnormal gait, confusion, noted to be last known well around approximately 620 this morning.  The patient is mildly hypertensive but otherwise unremarkable vital signs.  On neurological exam he has some very mild expressive aphasia  with occasional word finding difficulty, ataxic gait, but no acute complaints and no other significant neurological deficits.  Patient was activated as a code stroke.  CT scan of the head with no acute infarct.  Patient's lab work per my review showed no acute or emergent findings, glucose within normal limits, UA is still pending.  Pending discussion also with neurology [MT]  1051 Glucose(!): 126 [AH]  1052 CT HEAD CODE STROKE WO CONTRAST I personally visualized and interpreted the images using our PACS system. Acute findings include:  CT without acute finding  [  AH]    Clinical Course User Index [AH] Arthor Captain, PA-C [MT] Renaye Rakers Kermit Balo, MD                                 Medical Decision Making See DDX per ED course  Co morbidities:       has a past medical history of Body mass index (bmi) 28.0-28.9, adult, Gout, Hyperlipidemia, and Hypertension.   Social Determinants of Health:       SDOH Screenings Social Connections: Unknown (06/13/2021)     Received from [facility], [facility] Tobacco Use: Medium Risk (11/16/2022)   Additional history:  {Additional history obtained from wife   Lab Tests:  I Ordered, and personally interpreted labs.  The pertinent results include:   Labs reviewed Mild hyperglycemia   Imaging Studies:  I ordered imaging studies including Ct head I independently visualized and interpreted imaging which showed no acute findings I agree with the radiologist interpretation  Cardiac Monitoring/ECG:       The patient was maintained on a cardiac monitor.  I personally viewed and interpreted the cardiac monitored which showed an underlying rhythm of: Normal sinus rhythm, EKG shows sinus rhythm with left bundle branch block  Medicines ordered and prescription drug management: 11:59 AM Reevaluated the patient.  Symptoms resolved  Test Considered:       MRI which will be done inpatient  Critical Interventions:          Consultations Obtained: Consultations obtained with Dr. Otelia Limes, Dr. Wilford Corner, patient will be followed by stroke team and patient  Problem List / ED Course:       (G45.9) TIA (transient ischemic attack)  (primary encounter diagnosis)    MDM: Patient here with TIA versus encephalopathic symptoms resolved at this time.  Will be admitted for TIA workup.  Patient and wife understand and are in agreement with plan for admission.   Dispostion:  After consideration of the diagnostic results and the patients response to treatment, I feel that the patent would benefit from admission.    Amount and/or Complexity of Data Reviewed Labs: ordered. Decision-making details documented in ED Course. Radiology: ordered. Decision-making details documented in ED Course.  Risk Decision regarding hospitalization.           Final Clinical Impression(s) / ED Diagnoses Final diagnoses:  None    Rx / DC Orders ED Discharge Orders     None         Arthor Captain, PA-C 11/16/22 1200    Terald Sleeper, MD 11/16/22 1736

## 2022-11-16 NOTE — Plan of Care (Signed)
[facility] Tonsillectomy/Adenoidectomy, Postoperative PEDS care plan entered in error.

## 2022-11-16 NOTE — ED Notes (Signed)
[place] with  cl called for transport 

## 2022-11-16 NOTE — ED Notes (Signed)
Pt at CT with RN

## 2022-11-17 ENCOUNTER — Observation Stay (HOSPITAL_COMMUNITY): Payer: PPO

## 2022-11-17 ENCOUNTER — Other Ambulatory Visit (HOSPITAL_COMMUNITY): Payer: Self-pay

## 2022-11-17 ENCOUNTER — Observation Stay (HOSPITAL_BASED_OUTPATIENT_CLINIC_OR_DEPARTMENT_OTHER): Payer: PPO

## 2022-11-17 DIAGNOSIS — R569 Unspecified convulsions: Secondary | ICD-10-CM

## 2022-11-17 DIAGNOSIS — E041 Nontoxic single thyroid nodule: Secondary | ICD-10-CM | POA: Diagnosis not present

## 2022-11-17 DIAGNOSIS — G459 Transient cerebral ischemic attack, unspecified: Secondary | ICD-10-CM | POA: Diagnosis not present

## 2022-11-17 DIAGNOSIS — I700 Atherosclerosis of aorta: Secondary | ICD-10-CM | POA: Diagnosis not present

## 2022-11-17 DIAGNOSIS — R29818 Other symptoms and signs involving the nervous system: Secondary | ICD-10-CM | POA: Diagnosis not present

## 2022-11-17 LAB — URINALYSIS, W/ REFLEX TO CULTURE (INFECTION SUSPECTED)
Bacteria, UA: NONE SEEN
Bilirubin Urine: NEGATIVE
Glucose, UA: NEGATIVE mg/dL
Hgb urine dipstick: NEGATIVE
Ketones, ur: NEGATIVE mg/dL
Leukocytes,Ua: NEGATIVE
Nitrite: NEGATIVE
Protein, ur: 30 mg/dL — AB
Specific Gravity, Urine: 1.023 (ref 1.005–1.030)
pH: 6 (ref 5.0–8.0)

## 2022-11-17 LAB — ECHOCARDIOGRAM COMPLETE BUBBLE STUDY
Area-P 1/2: 2.5 cm2
Calc EF: 55 %
MV M vel: 4.31 m/s
MV Peak grad: 74.3 mm[Hg]
MV VTI: 3.1 cm2
S' Lateral: 3.45 cm
Single Plane A2C EF: 55.3 %
Single Plane A4C EF: 55 %

## 2022-11-17 LAB — HEMOGLOBIN A1C
Hgb A1c MFr Bld: 6 % — ABNORMAL HIGH (ref 4.8–5.6)
Mean Plasma Glucose: 125.5 mg/dL

## 2022-11-17 LAB — LIPID PANEL
Cholesterol: 125 mg/dL (ref 0–200)
HDL: 57 mg/dL (ref >40)
LDL Cholesterol: 33 mg/dL (ref 0–99)
Total CHOL/HDL Ratio: 2.2 {ratio}
Triglycerides: 175 mg/dL — ABNORMAL HIGH (ref <150)
VLDL: 35 mg/dL (ref 0–40)

## 2022-11-17 LAB — MAGNESIUM: Magnesium: 1.7 mg/dL (ref 1.7–2.4)

## 2022-11-17 MED ORDER — ASPIRIN EC 81 MG PO TBEC: 81.0000 mg | DELAYED_RELEASE_TABLET | Freq: Every evening | ORAL | 0 refills | Status: DC

## 2022-11-17 MED ORDER — CLOPIDOGREL BISULFATE 75 MG PO TABS
75.0000 mg | ORAL_TABLET | Freq: Every day | ORAL | Status: DC
  Administered 2022-11-17: 75 mg via ORAL
  Filled 2022-11-17: qty 1

## 2022-11-17 MED ORDER — CLOPIDOGREL BISULFATE 75 MG PO TABS: 75.0000 mg | ORAL_TABLET | Freq: Every day | ORAL | 0 refills | Status: AC

## 2022-11-17 MED ORDER — MELATONIN 5 MG PO TABS: 5.0000 mg | ORAL_TABLET | Freq: Every evening | ORAL | Status: DC | PRN

## 2022-11-17 MED ORDER — CLOPIDOGREL BISULFATE 75 MG PO TABS: 75.0000 mg | ORAL_TABLET | Freq: Every day | ORAL | 0 refills | Status: DC

## 2022-11-17 MED ORDER — CLOPIDOGREL BISULFATE 75 MG PO TABS
75.0000 mg | ORAL_TABLET | Freq: Every day | ORAL | 0 refills | Status: DC
  Filled 2022-11-17: qty 90, 90d supply, fill #0

## 2022-11-17 MED ORDER — ASPIRIN EC 81 MG PO TBEC: 81.0000 mg | DELAYED_RELEASE_TABLET | Freq: Every evening | ORAL | 0 refills | Status: AC

## 2022-11-17 MED ORDER — ACETAMINOPHEN 325 MG PO TABS: 650.0000 mg | ORAL_TABLET | Freq: Four times a day (QID) | ORAL | Status: DC | PRN

## 2022-11-17 MED ORDER — IOHEXOL 350 MG/ML SOLN
75.0000 mL | Freq: Once | INTRAVENOUS | Status: AC | PRN
  Administered 2022-11-17: 75 mL via INTRAVENOUS

## 2022-11-17 MED ORDER — ENOXAPARIN SODIUM 40 MG/0.4ML IJ SOSY
40.0000 mg | PREFILLED_SYRINGE | INTRAMUSCULAR | Status: DC
  Administered 2022-11-17: 40 mg via SUBCUTANEOUS
  Filled 2022-11-17: qty 0.4

## 2022-11-17 MED ORDER — ASPIRIN EC 81 MG PO TBEC
81.0000 mg | DELAYED_RELEASE_TABLET | Freq: Every evening | ORAL | 0 refills | Status: DC
  Filled 2022-11-17: qty 20, 20d supply, fill #0

## 2022-11-17 MED ORDER — PROCHLORPERAZINE EDISYLATE 10 MG/2ML IJ SOLN: 5.0000 mg | Freq: Four times a day (QID) | INTRAMUSCULAR | Status: DC | PRN

## 2022-11-17 MED ORDER — POLYETHYLENE GLYCOL 3350 17 G PO PACK: 17.0000 g | PACK | Freq: Every day | ORAL | Status: DC | PRN

## 2022-11-17 NOTE — Discharge Summary (Addendum)
and/or kV according to patient size and/or use of iterative reconstruction technique. CONTRAST:  75mL OMNIPAQUE IOHEXOL 350 MG/ML SOLN COMPARISON:  None Available. FINDINGS: CT HEAD FINDINGS Brain: No evidence of acute infarction, hemorrhage, hydrocephalus, extra-axial collection or mass lesion/mass effect. Vascular: See below. Skull: No acute fracture. Sinuses/Orbits: Clear sinuses.  No acute orbital findings. Other: No mastoid effusions. Review of the MIP images confirms the above findings CTA NECK FINDINGS Aortic arch: Great vessel origins are patent without significant stenosis. Calcific atherosclerosis of the aorta. Right carotid system: No evidence of dissection, stenosis  (50% or greater), or occlusion. Left carotid system: No evidence of dissection, stenosis (50% or greater), or occlusion. Vertebral arteries: Left dominant. No evidence of dissection, stenosis (50% or greater), or occlusion. Skeleton: No acute abnormality on limited assessment. Other neck: Approximately 1.8 cm left thyroid nodule. Upper chest: Emphysema. Review of the MIP images confirms the above findings CTA HEAD FINDINGS Anterior circulation: Bilateral intracranial ICAs, MCAs, and ACAs are patent without proximal hemodynamically significant stenosis. Posterior circulation: Bilateral intradural vertebral arteries, basilar artery and bilateral posterior cerebral arteries are patent without proximal hemodynamically significant stenosis. Venous sinuses: As permitted by contrast timing, patent. Prominent draining vein in the inferior left frontal lobe, likely a developmental venous anomaly. Review of the MIP images confirms the above findings IMPRESSION: 1. No emergent large vessel occlusion or proximal hemodynamically significant stenosis. 2. Approximately 1.8 cm left thyroid nodule. Recommend thyroid ultrasound (ref: J Am Coll Radiol. 2015 Feb;12(2): 143-50). 3. Aortic Atherosclerosis (ICD10-I70.0) and Emphysema (ICD10-J43.9). Electronically Signed   By: Feliberto Harts M.D.   On: 11/17/2022 14:09   EEG adult  Result Date: 11/17/2022 Charlsie Quest, MD     11/17/2022  1:37 PM Patient Name: Frank Foster MRN: [id number] Epilepsy Attending: Charlsie Quest Referring Physician/Provider: Elmer Picker, NP Date: 11/17/2022 Duration: 24.23 mins Patient history: 77yo M presented with slurred speech and difficulty word finding. EEG to evaluate for seizure Level of alertness: Awake AEDs during EEG study: None Technical aspects: This EEG study was done with scalp electrodes positioned according to the 10-20 International system of electrode placement. Electrical activity was reviewed with band pass filter of  1-70Hz , sensitivity of 7 uV/mm, display speed of 45mm/sec with a 60Hz  notched filter applied as appropriate. EEG data were recorded continuously and digitally stored.  Video monitoring was available and reviewed as appropriate. Description: The posterior dominant rhythm consists of 9 Hz activity of moderate voltage (25-35 uV) seen predominantly in posterior head regions, symmetric and reactive to eye opening and eye closing. Physiologic photic driving was seen during photic stimulation.  Hyperventilation was not performed.   IMPRESSION: This study is within normal limits. No seizures or epileptiform discharges were seen throughout the recording. A normal interictal EEG does not exclude the diagnosis of epilepsy. Charlsie Quest   ECHOCARDIOGRAM COMPLETE BUBBLE STUDY  Result Date: 11/17/2022    ECHOCARDIOGRAM REPORT   Patient Name:   Frank Foster Date of Exam: 11/17/2022 Medical Rec #:  [id number]         Height:       70.0 in Accession #:    [id number]        Weight:       208.9 lb Date of Birth:  [birth year]         BSA:          2.126 m Patient Age:    77 years          BP:  and/or kV according to patient size and/or use of iterative reconstruction technique. CONTRAST:  75mL OMNIPAQUE IOHEXOL 350 MG/ML SOLN COMPARISON:  None Available. FINDINGS: CT HEAD FINDINGS Brain: No evidence of acute infarction, hemorrhage, hydrocephalus, extra-axial collection or mass lesion/mass effect. Vascular: See below. Skull: No acute fracture. Sinuses/Orbits: Clear sinuses.  No acute orbital findings. Other: No mastoid effusions. Review of the MIP images confirms the above findings CTA NECK FINDINGS Aortic arch: Great vessel origins are patent without significant stenosis. Calcific atherosclerosis of the aorta. Right carotid system: No evidence of dissection, stenosis  (50% or greater), or occlusion. Left carotid system: No evidence of dissection, stenosis (50% or greater), or occlusion. Vertebral arteries: Left dominant. No evidence of dissection, stenosis (50% or greater), or occlusion. Skeleton: No acute abnormality on limited assessment. Other neck: Approximately 1.8 cm left thyroid nodule. Upper chest: Emphysema. Review of the MIP images confirms the above findings CTA HEAD FINDINGS Anterior circulation: Bilateral intracranial ICAs, MCAs, and ACAs are patent without proximal hemodynamically significant stenosis. Posterior circulation: Bilateral intradural vertebral arteries, basilar artery and bilateral posterior cerebral arteries are patent without proximal hemodynamically significant stenosis. Venous sinuses: As permitted by contrast timing, patent. Prominent draining vein in the inferior left frontal lobe, likely a developmental venous anomaly. Review of the MIP images confirms the above findings IMPRESSION: 1. No emergent large vessel occlusion or proximal hemodynamically significant stenosis. 2. Approximately 1.8 cm left thyroid nodule. Recommend thyroid ultrasound (ref: J Am Coll Radiol. 2015 Feb;12(2): 143-50). 3. Aortic Atherosclerosis (ICD10-I70.0) and Emphysema (ICD10-J43.9). Electronically Signed   By: Feliberto Harts M.D.   On: 11/17/2022 14:09   EEG adult  Result Date: 11/17/2022 Charlsie Quest, MD     11/17/2022  1:37 PM Patient Name: Frank Foster MRN: [id number] Epilepsy Attending: Charlsie Quest Referring Physician/Provider: Elmer Picker, NP Date: 11/17/2022 Duration: 24.23 mins Patient history: 77yo M presented with slurred speech and difficulty word finding. EEG to evaluate for seizure Level of alertness: Awake AEDs during EEG study: None Technical aspects: This EEG study was done with scalp electrodes positioned according to the 10-20 International system of electrode placement. Electrical activity was reviewed with band pass filter of  1-70Hz , sensitivity of 7 uV/mm, display speed of 45mm/sec with a 60Hz  notched filter applied as appropriate. EEG data were recorded continuously and digitally stored.  Video monitoring was available and reviewed as appropriate. Description: The posterior dominant rhythm consists of 9 Hz activity of moderate voltage (25-35 uV) seen predominantly in posterior head regions, symmetric and reactive to eye opening and eye closing. Physiologic photic driving was seen during photic stimulation.  Hyperventilation was not performed.   IMPRESSION: This study is within normal limits. No seizures or epileptiform discharges were seen throughout the recording. A normal interictal EEG does not exclude the diagnosis of epilepsy. Charlsie Quest   ECHOCARDIOGRAM COMPLETE BUBBLE STUDY  Result Date: 11/17/2022    ECHOCARDIOGRAM REPORT   Patient Name:   Frank Foster Date of Exam: 11/17/2022 Medical Rec #:  [id number]         Height:       70.0 in Accession #:    [id number]        Weight:       208.9 lb Date of Birth:  [birth year]         BSA:          2.126 m Patient Age:    77 years          BP:  Physician Discharge Summary  Frank Foster AOZ:[id number] DOB: April 30, 1945  PCP: Lewis Moccasin, MD  Admitted from: Home Discharged to: Home  Admit date: 11/16/2022 Discharge date: 11/17/2022  Recommendations for Outpatient Follow-up:    Follow-up Information     Lewis Moccasin, MD. Schedule an appointment as soon as possible for a visit in 1 week(s).   Specialty: Family Medicine Why: To be seen with repeat labs (CBC & BMP). Contact information: [address] [place] [address] [phone]                  Home Health: None    Equipment/Devices: None    Discharge Condition: Improved and stable.   Code Status: Full Code Diet recommendation:  Discharge Diet Orders (From admission, onward)     Start     Ordered   11/17/22 0000  Diet - low sodium heart healthy        11/17/22 1546             Discharge Diagnoses:  Principal Problem:   TIA (transient ischemic attack)   Brief Summary: 77 year old married male, independent, medical history significant for dilated cardiomyopathy, HFpEF with LVEF 50-55% and grade 1 diastolic dysfunction, hypertension, hyperlipidemia, chronic anxiety/depression, gout, who initially presented to the [facility] ED with complaints of slurred speech, word finding difficulty and gait instability.  Per discussion with spouse, symptoms reportedly started around 7:45 AM on day of admission.  LKW night prior.  Code stroke was activated in ED.  Noncontrast CT head did not show any acute abnormality.  While in ED, his symptoms resolved.  Seen by Teleneurology, hospital admission requested for stroke workup.  Assessment and plan:  TIA Presented with symptoms as noted above. CT head code stroke: No evidence of acute intracranial abnormality.  Aspects of 10. CTA of head and neck: No emergent large vessel occlusion or proximal hemodynamically significant stenosis. MRI brain: No evidence of acute intracranial  abnormality. TTE: LVEF 50-55%.  Grade 1 diastolic dysfunction.  No aortic stenosis. EEG: Normal.  No seizures or epileptiform discharges were seen LDL 33, A1c 6. BAL <10 Symptoms had resolved while he was still in the ED.  Therapies evaluated and did not see any needs at discharge Neurology consultation appreciated.  They recommend aspirin 81 Mg daily + Plavix 75 Mg daily x 3 weeks followed by Plavix 75 Mg alone.  Continue home dose of statin. Outpatient neurology follow-up.  Essential hypertension Allowed for permissive hypertension Continue home dose of carvedilol and Entresto.  Dilated cardiomyopathy Based on updated echo, LVEF has improved Continue carvedilol and Entresto Outpatient follow-up with cardiology  Hyperlipidemia LDL 33, goal <70 Continue home dose of Crestor 5 Mg daily.  Controlled.  History of gout: Continue allopurinol  Anxiety/depression Continue Zoloft  Mild dilatation of aortic root, measuring 40 mm Noted on 2D echo Outpatient follow-up and evaluation as deemed necessary.  Left thyroid nodule: Approximately 1.8 cm left thyroid nodule noted on CTA head and neck.  Ultrasound thyroid recommended which can be pursued as outpatient via PCP. The recommendation was discussed in detail with patient on day of discharge and he verbalized understanding.  He indicated that he had an upcoming follow-up with PCP on 10/29 and was going to discuss with PCP.  Prediabetes: A1c 6.  Lifestyle modifications including diet, exercise and weight loss and outpatient monitoring.    Consultations: Neurology  Procedures: None   Discharge Instructions  Discharge Instructions     Activity as tolerated - No  and/or kV according to patient size and/or use of iterative reconstruction technique. CONTRAST:  75mL OMNIPAQUE IOHEXOL 350 MG/ML SOLN COMPARISON:  None Available. FINDINGS: CT HEAD FINDINGS Brain: No evidence of acute infarction, hemorrhage, hydrocephalus, extra-axial collection or mass lesion/mass effect. Vascular: See below. Skull: No acute fracture. Sinuses/Orbits: Clear sinuses.  No acute orbital findings. Other: No mastoid effusions. Review of the MIP images confirms the above findings CTA NECK FINDINGS Aortic arch: Great vessel origins are patent without significant stenosis. Calcific atherosclerosis of the aorta. Right carotid system: No evidence of dissection, stenosis  (50% or greater), or occlusion. Left carotid system: No evidence of dissection, stenosis (50% or greater), or occlusion. Vertebral arteries: Left dominant. No evidence of dissection, stenosis (50% or greater), or occlusion. Skeleton: No acute abnormality on limited assessment. Other neck: Approximately 1.8 cm left thyroid nodule. Upper chest: Emphysema. Review of the MIP images confirms the above findings CTA HEAD FINDINGS Anterior circulation: Bilateral intracranial ICAs, MCAs, and ACAs are patent without proximal hemodynamically significant stenosis. Posterior circulation: Bilateral intradural vertebral arteries, basilar artery and bilateral posterior cerebral arteries are patent without proximal hemodynamically significant stenosis. Venous sinuses: As permitted by contrast timing, patent. Prominent draining vein in the inferior left frontal lobe, likely a developmental venous anomaly. Review of the MIP images confirms the above findings IMPRESSION: 1. No emergent large vessel occlusion or proximal hemodynamically significant stenosis. 2. Approximately 1.8 cm left thyroid nodule. Recommend thyroid ultrasound (ref: J Am Coll Radiol. 2015 Feb;12(2): 143-50). 3. Aortic Atherosclerosis (ICD10-I70.0) and Emphysema (ICD10-J43.9). Electronically Signed   By: Feliberto Harts M.D.   On: 11/17/2022 14:09   EEG adult  Result Date: 11/17/2022 Charlsie Quest, MD     11/17/2022  1:37 PM Patient Name: Frank Foster MRN: [id number] Epilepsy Attending: Charlsie Quest Referring Physician/Provider: Elmer Picker, NP Date: 11/17/2022 Duration: 24.23 mins Patient history: 77yo M presented with slurred speech and difficulty word finding. EEG to evaluate for seizure Level of alertness: Awake AEDs during EEG study: None Technical aspects: This EEG study was done with scalp electrodes positioned according to the 10-20 International system of electrode placement. Electrical activity was reviewed with band pass filter of  1-70Hz , sensitivity of 7 uV/mm, display speed of 45mm/sec with a 60Hz  notched filter applied as appropriate. EEG data were recorded continuously and digitally stored.  Video monitoring was available and reviewed as appropriate. Description: The posterior dominant rhythm consists of 9 Hz activity of moderate voltage (25-35 uV) seen predominantly in posterior head regions, symmetric and reactive to eye opening and eye closing. Physiologic photic driving was seen during photic stimulation.  Hyperventilation was not performed.   IMPRESSION: This study is within normal limits. No seizures or epileptiform discharges were seen throughout the recording. A normal interictal EEG does not exclude the diagnosis of epilepsy. Charlsie Quest   ECHOCARDIOGRAM COMPLETE BUBBLE STUDY  Result Date: 11/17/2022    ECHOCARDIOGRAM REPORT   Patient Name:   Frank Foster Date of Exam: 11/17/2022 Medical Rec #:  [id number]         Height:       70.0 in Accession #:    [id number]        Weight:       208.9 lb Date of Birth:  [birth year]         BSA:          2.126 m Patient Age:    77 years          BP:  and/or kV according to patient size and/or use of iterative reconstruction technique. CONTRAST:  75mL OMNIPAQUE IOHEXOL 350 MG/ML SOLN COMPARISON:  None Available. FINDINGS: CT HEAD FINDINGS Brain: No evidence of acute infarction, hemorrhage, hydrocephalus, extra-axial collection or mass lesion/mass effect. Vascular: See below. Skull: No acute fracture. Sinuses/Orbits: Clear sinuses.  No acute orbital findings. Other: No mastoid effusions. Review of the MIP images confirms the above findings CTA NECK FINDINGS Aortic arch: Great vessel origins are patent without significant stenosis. Calcific atherosclerosis of the aorta. Right carotid system: No evidence of dissection, stenosis  (50% or greater), or occlusion. Left carotid system: No evidence of dissection, stenosis (50% or greater), or occlusion. Vertebral arteries: Left dominant. No evidence of dissection, stenosis (50% or greater), or occlusion. Skeleton: No acute abnormality on limited assessment. Other neck: Approximately 1.8 cm left thyroid nodule. Upper chest: Emphysema. Review of the MIP images confirms the above findings CTA HEAD FINDINGS Anterior circulation: Bilateral intracranial ICAs, MCAs, and ACAs are patent without proximal hemodynamically significant stenosis. Posterior circulation: Bilateral intradural vertebral arteries, basilar artery and bilateral posterior cerebral arteries are patent without proximal hemodynamically significant stenosis. Venous sinuses: As permitted by contrast timing, patent. Prominent draining vein in the inferior left frontal lobe, likely a developmental venous anomaly. Review of the MIP images confirms the above findings IMPRESSION: 1. No emergent large vessel occlusion or proximal hemodynamically significant stenosis. 2. Approximately 1.8 cm left thyroid nodule. Recommend thyroid ultrasound (ref: J Am Coll Radiol. 2015 Feb;12(2): 143-50). 3. Aortic Atherosclerosis (ICD10-I70.0) and Emphysema (ICD10-J43.9). Electronically Signed   By: Feliberto Harts M.D.   On: 11/17/2022 14:09   EEG adult  Result Date: 11/17/2022 Charlsie Quest, MD     11/17/2022  1:37 PM Patient Name: Frank Foster MRN: [id number] Epilepsy Attending: Charlsie Quest Referring Physician/Provider: Elmer Picker, NP Date: 11/17/2022 Duration: 24.23 mins Patient history: 77yo M presented with slurred speech and difficulty word finding. EEG to evaluate for seizure Level of alertness: Awake AEDs during EEG study: None Technical aspects: This EEG study was done with scalp electrodes positioned according to the 10-20 International system of electrode placement. Electrical activity was reviewed with band pass filter of  1-70Hz , sensitivity of 7 uV/mm, display speed of 45mm/sec with a 60Hz  notched filter applied as appropriate. EEG data were recorded continuously and digitally stored.  Video monitoring was available and reviewed as appropriate. Description: The posterior dominant rhythm consists of 9 Hz activity of moderate voltage (25-35 uV) seen predominantly in posterior head regions, symmetric and reactive to eye opening and eye closing. Physiologic photic driving was seen during photic stimulation.  Hyperventilation was not performed.   IMPRESSION: This study is within normal limits. No seizures or epileptiform discharges were seen throughout the recording. A normal interictal EEG does not exclude the diagnosis of epilepsy. Charlsie Quest   ECHOCARDIOGRAM COMPLETE BUBBLE STUDY  Result Date: 11/17/2022    ECHOCARDIOGRAM REPORT   Patient Name:   Frank Foster Date of Exam: 11/17/2022 Medical Rec #:  [id number]         Height:       70.0 in Accession #:    [id number]        Weight:       208.9 lb Date of Birth:  [birth year]         BSA:          2.126 m Patient Age:    77 years          BP:  Physician Discharge Summary  Frank Foster AOZ:[id number] DOB: April 30, 1945  PCP: Lewis Moccasin, MD  Admitted from: Home Discharged to: Home  Admit date: 11/16/2022 Discharge date: 11/17/2022  Recommendations for Outpatient Follow-up:    Follow-up Information     Lewis Moccasin, MD. Schedule an appointment as soon as possible for a visit in 1 week(s).   Specialty: Family Medicine Why: To be seen with repeat labs (CBC & BMP). Contact information: [address] [place] [address] [phone]                  Home Health: None    Equipment/Devices: None    Discharge Condition: Improved and stable.   Code Status: Full Code Diet recommendation:  Discharge Diet Orders (From admission, onward)     Start     Ordered   11/17/22 0000  Diet - low sodium heart healthy        11/17/22 1546             Discharge Diagnoses:  Principal Problem:   TIA (transient ischemic attack)   Brief Summary: 77 year old married male, independent, medical history significant for dilated cardiomyopathy, HFpEF with LVEF 50-55% and grade 1 diastolic dysfunction, hypertension, hyperlipidemia, chronic anxiety/depression, gout, who initially presented to the [facility] ED with complaints of slurred speech, word finding difficulty and gait instability.  Per discussion with spouse, symptoms reportedly started around 7:45 AM on day of admission.  LKW night prior.  Code stroke was activated in ED.  Noncontrast CT head did not show any acute abnormality.  While in ED, his symptoms resolved.  Seen by Teleneurology, hospital admission requested for stroke workup.  Assessment and plan:  TIA Presented with symptoms as noted above. CT head code stroke: No evidence of acute intracranial abnormality.  Aspects of 10. CTA of head and neck: No emergent large vessel occlusion or proximal hemodynamically significant stenosis. MRI brain: No evidence of acute intracranial  abnormality. TTE: LVEF 50-55%.  Grade 1 diastolic dysfunction.  No aortic stenosis. EEG: Normal.  No seizures or epileptiform discharges were seen LDL 33, A1c 6. BAL <10 Symptoms had resolved while he was still in the ED.  Therapies evaluated and did not see any needs at discharge Neurology consultation appreciated.  They recommend aspirin 81 Mg daily + Plavix 75 Mg daily x 3 weeks followed by Plavix 75 Mg alone.  Continue home dose of statin. Outpatient neurology follow-up.  Essential hypertension Allowed for permissive hypertension Continue home dose of carvedilol and Entresto.  Dilated cardiomyopathy Based on updated echo, LVEF has improved Continue carvedilol and Entresto Outpatient follow-up with cardiology  Hyperlipidemia LDL 33, goal <70 Continue home dose of Crestor 5 Mg daily.  Controlled.  History of gout: Continue allopurinol  Anxiety/depression Continue Zoloft  Mild dilatation of aortic root, measuring 40 mm Noted on 2D echo Outpatient follow-up and evaluation as deemed necessary.  Left thyroid nodule: Approximately 1.8 cm left thyroid nodule noted on CTA head and neck.  Ultrasound thyroid recommended which can be pursued as outpatient via PCP. The recommendation was discussed in detail with patient on day of discharge and he verbalized understanding.  He indicated that he had an upcoming follow-up with PCP on 10/29 and was going to discuss with PCP.  Prediabetes: A1c 6.  Lifestyle modifications including diet, exercise and weight loss and outpatient monitoring.    Consultations: Neurology  Procedures: None   Discharge Instructions  Discharge Instructions     Activity as tolerated - No  Physician Discharge Summary  Frank Foster AOZ:[id number] DOB: April 30, 1945  PCP: Lewis Moccasin, MD  Admitted from: Home Discharged to: Home  Admit date: 11/16/2022 Discharge date: 11/17/2022  Recommendations for Outpatient Follow-up:    Follow-up Information     Lewis Moccasin, MD. Schedule an appointment as soon as possible for a visit in 1 week(s).   Specialty: Family Medicine Why: To be seen with repeat labs (CBC & BMP). Contact information: [address] [place] [address] [phone]                  Home Health: None    Equipment/Devices: None    Discharge Condition: Improved and stable.   Code Status: Full Code Diet recommendation:  Discharge Diet Orders (From admission, onward)     Start     Ordered   11/17/22 0000  Diet - low sodium heart healthy        11/17/22 1546             Discharge Diagnoses:  Principal Problem:   TIA (transient ischemic attack)   Brief Summary: 77 year old married male, independent, medical history significant for dilated cardiomyopathy, HFpEF with LVEF 50-55% and grade 1 diastolic dysfunction, hypertension, hyperlipidemia, chronic anxiety/depression, gout, who initially presented to the [facility] ED with complaints of slurred speech, word finding difficulty and gait instability.  Per discussion with spouse, symptoms reportedly started around 7:45 AM on day of admission.  LKW night prior.  Code stroke was activated in ED.  Noncontrast CT head did not show any acute abnormality.  While in ED, his symptoms resolved.  Seen by Teleneurology, hospital admission requested for stroke workup.  Assessment and plan:  TIA Presented with symptoms as noted above. CT head code stroke: No evidence of acute intracranial abnormality.  Aspects of 10. CTA of head and neck: No emergent large vessel occlusion or proximal hemodynamically significant stenosis. MRI brain: No evidence of acute intracranial  abnormality. TTE: LVEF 50-55%.  Grade 1 diastolic dysfunction.  No aortic stenosis. EEG: Normal.  No seizures or epileptiform discharges were seen LDL 33, A1c 6. BAL <10 Symptoms had resolved while he was still in the ED.  Therapies evaluated and did not see any needs at discharge Neurology consultation appreciated.  They recommend aspirin 81 Mg daily + Plavix 75 Mg daily x 3 weeks followed by Plavix 75 Mg alone.  Continue home dose of statin. Outpatient neurology follow-up.  Essential hypertension Allowed for permissive hypertension Continue home dose of carvedilol and Entresto.  Dilated cardiomyopathy Based on updated echo, LVEF has improved Continue carvedilol and Entresto Outpatient follow-up with cardiology  Hyperlipidemia LDL 33, goal <70 Continue home dose of Crestor 5 Mg daily.  Controlled.  History of gout: Continue allopurinol  Anxiety/depression Continue Zoloft  Mild dilatation of aortic root, measuring 40 mm Noted on 2D echo Outpatient follow-up and evaluation as deemed necessary.  Left thyroid nodule: Approximately 1.8 cm left thyroid nodule noted on CTA head and neck.  Ultrasound thyroid recommended which can be pursued as outpatient via PCP. The recommendation was discussed in detail with patient on day of discharge and he verbalized understanding.  He indicated that he had an upcoming follow-up with PCP on 10/29 and was going to discuss with PCP.  Prediabetes: A1c 6.  Lifestyle modifications including diet, exercise and weight loss and outpatient monitoring.    Consultations: Neurology  Procedures: None   Discharge Instructions  Discharge Instructions     Activity as tolerated - No

## 2022-11-17 NOTE — Progress Notes (Signed)
OT Cancellation Note  Patient Details Name: Frank Foster MRN: [id number] DOB: 1945-12-05   Cancelled Treatment:    Reason Eval/Treat Not Completed: OT screened, no needs identified, will sign off (Per TP, no OT needs, pt is near his functional baseline.)  Donia Pounds 11/17/2022, 12:55 PM

## 2022-11-17 NOTE — Progress Notes (Signed)
PT Cancellation Note  Patient Details Name: Frank Foster MRN: [id number] DOB: 1945-08-03   Cancelled Treatment:    Reason Eval/Treat Not Completed: Patient at procedure or test/unavailable  Currently off unit for Echo. Will check back later this morning for PT evaluation. Of note, wife reports pt seems to be back to baseline. We can assess for functional deficits when he returns.   Kathlyn Sacramento, PT, DPT [facility]  Rehabilitation Services Physical Therapist Office: [phone] Website: [email or web address]   Berton Mount 11/17/2022, 9:15 AM

## 2022-11-17 NOTE — H&P (Signed)
History and Physical  Frank Foster:[id number] DOB: 1945/10/27 DOA: 11/16/2022  Referring physician: Accpeted by Dr. Sunnie Nielsen, [facility].  PCP: Lewis Moccasin, MD  Outpatient Specialists: Cardiology Patient coming from: Home  Chief Complaint: Slurred speech, difficulty with word findings   HPI: Frank Foster is a 77 y.o. male with medical history significant for dilated cardiomyopathy, HFpEF 50 to 55% with grade 1 diastolic dysfunction, hypertension, hyperlipidemia, chronic anxiety/depression, gout, who initially presented to [facility] ED with complaints of slurred speech and difficulty word finding.  Onset around 6:30 AM.  Last known well was before bed.  Associated with ataxic gait.  The ED, code stroke was activated.  Noncontrast head CT was unremarkable for any acute intracranial abnormality.  While in the ED his symptoms resolved.  Seen by neurology/stroke team with recommendation for admission to [facility] for TIA workup.  Admitted by [facility], hospitalist service.  ED Course: Temperature 97.9.  BP 141/70, pulse 61, respiration rate 18, O2 saturation 95% on room air.  Lab studies unremarkable except for elevated serum glucose 126.  Review of Systems: Review of systems as noted in the HPI. All other systems reviewed and are negative.   Past Medical History:  Diagnosis Date   Body mass index (bmi) 28.0-28.9, adult    Gout    Hyperlipidemia    Hypertension    Past Surgical History:  Procedure Laterality Date   broken nose     CHOLECYSTECTOMY N/A 08/28/2019   Procedure: LAPAROSCOPIC CHOLECYSTECTOMY WITH INTRAOPERATIVE CHOLANGIOGRAM;  Surgeon: Luretha Murphy, MD;  Location: [facility] ORS;  Service: General;  Laterality: N/A;   KNEE SURGERY     arthroscopic right knee   RIGHT/LEFT HEART CATH AND CORONARY ANGIOGRAPHY N/A 01/23/2018   Procedure: RIGHT/LEFT HEART CATH AND CORONARY ANGIOGRAPHY;  Surgeon: Kathleene Hazel, MD;  Location: [facility] INVASIVE CV LAB;  Service:  Cardiovascular;  Laterality: N/A;    Social History:  reports that he quit smoking about 9 years ago. His smoking use included cigarettes. He has never used smokeless tobacco. He reports current alcohol use of about 5.0 standard drinks of alcohol per week. He reports that he does not use drugs.   Allergies  Allergen Reactions   Penicillins Nausea And Vomiting    Severe vomiting! DID THE REACTION INVOLVE: Swelling of the face/tongue/throat, SOB, or low BP? No Sudden or severe rash/hives, skin peeling, or the inside of the mouth or nose? No Did it require medical treatment? No When did it last happen?      childhood allergy If all above answers are "NO", may proceed with cephalosporin use.     Family History  Problem Relation Age of Onset   Heart disease Mother    Prostate cancer Father    Heart disease Father       Prior to Admission medications   Medication Sig Start Date End Date Taking? Authorizing Provider  acetaminophen (TYLENOL) 325 MG tablet Take 650 mg by mouth every 6 (six) hours as needed for mild pain or headache.   Yes [provider]  allopurinol (ZYLOPRIM) 100 MG tablet Take 100 mg by mouth every evening.    Yes [provider]  aspirin EC 81 MG tablet Take 81 mg by mouth every evening.   Yes [provider]  carvedilol (COREG) 12.5 MG tablet Take 12.5 mg by mouth 2 (two) times daily with a meal.   Yes [provider]  Cholecalciferol (VITAMIN D-3 PO) Take 1 capsule by mouth daily at 6 (  six) AM.   Yes [provider]  rosuvastatin (CRESTOR) 5 MG tablet Take 5 mg by mouth every evening.    Yes [provider]  sacubitril-valsartan (ENTRESTO) 97-103 MG Take 1 tablet by mouth 2 (two) times daily. 09/11/22  Yes Wendall Stade, MD  sertraline (ZOLOFT) 25 MG tablet Take 25 mg by mouth daily.   Yes [provider]  sildenafil (VIAGRA) 50 MG tablet Take 50-100 mg by mouth daily as needed for erectile dysfunction.  05/07/22  Yes [provider]  zolpidem (AMBIEN) 10 MG tablet Take 10 mg by mouth at bedtime.   Yes [provider]    Physical Exam: BP (!) 141/70 (BP Location: Left Arm)   Pulse 62   Temp 97.9 F (36.6 C) (Oral)   Resp 18   Ht 5\' 10"  (1.778 m)   Wt 93.5 kg   SpO2 95%   BMI 29.57 kg/m   General: 77 y.o. year-old male well developed well nourished in no acute distress.  Alert and oriented x3. Cardiovascular: Regular rate and rhythm with no rubs or gallops.  No thyromegaly or JVD noted.  No lower extremity edema. 2/4 pulses in all 4 extremities. Respiratory: Clear to auscultation with no wheezes or rales. Good inspiratory effort. Abdomen: Soft nontender nondistended with normal bowel sounds x4 quadrants. Muskuloskeletal: No cyanosis, clubbing or edema noted bilaterally Neuro: CN II-XII intact, strength, sensation, reflexes Skin: No ulcerative lesions noted or rashes Psychiatry: Judgement and insight appear normal. Mood is appropriate for condition and setting          Labs on Admission:  Basic Metabolic Panel: Recent Labs  Lab 11/16/22 0937  NA 141  K 4.5  CL 103  CO2 31  GLUCOSE 126*  BUN 17  CREATININE 1.11  CALCIUM 9.6   Liver Function Tests: Recent Labs  Lab 11/16/22 0937  AST 15  ALT 18  ALKPHOS 64  BILITOT 0.6  PROT 7.1  ALBUMIN 4.0   No results for input(s): "LIPASE", "AMYLASE" in the last 168 hours. No results for input(s): "AMMONIA" in the last 168 hours. CBC: Recent Labs  Lab 11/16/22 0937  WBC 5.7  NEUTROABS 3.6  HGB 15.5  HCT 46.1  MCV 97.1  PLT 179   Cardiac Enzymes: No results for input(s): "CKTOTAL", "CKMB", "CKMBINDEX", "TROPONINI" in the last 168 hours.  BNP (last 3 results) No results for input(s): "BNP" in the last 8760 hours.  ProBNP (last 3 results) No results for input(s): "PROBNP" in the last 8760 hours.  CBG: Recent Labs  Lab 11/16/22 0928  GLUCAP 110*    Radiological Exams on Admission: CT HEAD  CODE STROKE WO CONTRAST  Result Date: 11/16/2022 CLINICAL DATA:  Code stroke. Neuro deficit, acute, stroke suspected. Slurred speech and abnormal gait. EXAM: CT HEAD WITHOUT CONTRAST TECHNIQUE: Contiguous axial images were obtained from the base of the skull through the vertex without intravenous contrast. RADIATION DOSE REDUCTION: This exam was performed according to the departmental dose-optimization program which includes automated exposure control, adjustment of the mA and/or kV according to patient size and/or use of iterative reconstruction technique. COMPARISON:  Head CT 12/02/2020 FINDINGS: Brain: There is no evidence of an acute infarct, intracranial hemorrhage, mass, midline shift, or extra-axial fluid collection. Mild cerebral atrophy is unchanged. Vascular: Calcified atherosclerosis at the skull base. No hyperdense vessel. Skull: No acute fracture or suspicious osseous lesion. Sinuses/Orbits: Visualized paranasal sinuses and mastoid air cells are clear. Bilateral cataract extraction. Other: None. ASPECTS [facility] Stroke Program  Early CT Score) - Ganglionic level infarction (caudate, lentiform nuclei, internal capsule, insula, M1-M3 cortex): 7 - Supraganglionic infarction (M4-M6 cortex): 3 Total score (0-10 with 10 being normal): 10 These results were called by telephone at the time of interpretation on 11/16/2022 at 9:48 am to Dr. Alvester Chou , who verbally acknowledged these results. IMPRESSION: No evidence of acute intracranial abnormality. ASPECTS of 10. Electronically Signed   By: Sebastian Ache M.D.   On: 11/16/2022 10:04    EKG: I independently viewed the EKG done and my findings are as followed: Sinus rhythm rate of 63.  Nonspecific ST-T changes.  QTc 483.  Assessment/Plan Present on Admission:  TIA (transient ischemic attack)  Principal Problem:   TIA (transient ischemic attack)  Suspected TIA versus acute CVA Presented with slurred speech, difficulty finding words Initial  noncontrast head CT was nonacute. Admitted for TIA/CVA workup Neurochecks every 4 hours Permissive hypertension until stroke is ruled out Treat SBP greater than 220 or DBP greater than 120 Follow-up brain MRI 2D echo with bubble study Fasting lipid panel, A1c PT/OT/speech therapist evaluation Resume home aspirin and Crestor as recommended by neurology/stroke team.  HFpEF with grade 1 diastolic dysfunction Last 2D echo done on 06/15/2021 showed LVEF 50 to 55% with grade 1 diastolic dysfunction. Resume home cardiac medications once CVA is ruled out Start strict I's and O's and daily weight  Hyperlipidemia Resume home Crestor  Chronic anxiety/depression Resume home regimen  Gout Resume home regimen   Time: 75 minutes.   DVT prophylaxis: Subcu Lovenox daily  Code Status: Full code  Family Communication: Updated his wife at bedside.  Disposition Plan: Admitted to telemetry medical unit  Consults called: Neurology  Admission status: Observation status   Status is: Observation .   Darlin Drop MD [facility] Pager [phone]  If 7PM-7AM, please contact night-coverage [email or web address] Password [email or web address]  11/17/2022, 5:40 AM

## 2022-11-17 NOTE — Procedures (Signed)
Patient Name: Frank Foster  MRN: [id number]  Epilepsy Attending: Charlsie Quest  Referring Physician/Provider: Elmer Picker, NP  Date: 11/17/2022 Duration: 24.23 mins  Patient history: 77yo M presented with slurred speech and difficulty word finding. EEG to evaluate for seizure  Level of alertness: Awake  AEDs during EEG study: None  Technical aspects: This EEG study was done with scalp electrodes positioned according to the 10-20 International system of electrode placement. Electrical activity was reviewed with band pass filter of 1-70Hz , sensitivity of 7 uV/mm, display speed of 63mm/sec with a 60Hz  notched filter applied as appropriate. EEG data were recorded continuously and digitally stored.  Video monitoring was available and reviewed as appropriate.  Description: The posterior dominant rhythm consists of 9 Hz activity of moderate voltage (25-35 uV) seen predominantly in posterior head regions, symmetric and reactive to eye opening and eye closing. Physiologic photic driving was seen during photic stimulation.  Hyperventilation was not performed.     IMPRESSION: This study is within normal limits. No seizures or epileptiform discharges were seen throughout the recording.  A normal interictal EEG does not exclude the diagnosis of epilepsy.  Frank Foster

## 2022-11-17 NOTE — Progress Notes (Signed)
Physical Therapy Note  Patient feels back to baseline, independent and working. BERG score of 52/56 indicating low fall risk. Ambulates without loss of balance, navigates over obstacles safely. Educated on BEFAST acronym. Patient is functioning at a high level of independence and no physical therapy is indicated at this time. PT is signing-off. Please re-order if there is any significant change in status. Thank you for this referral.  Kathlyn Sacramento, PT, DPT [facility]  Rehabilitation Services Physical Therapist Office: [phone] Website: [email or web address]

## 2022-11-17 NOTE — Progress Notes (Signed)
11:09H Patient send to MRI via wheelchair with transport service.

## 2022-11-17 NOTE — Care Management Obs Status (Signed)
MEDICARE OBSERVATION STATUS NOTIFICATION   Patient Details  Name: Frank Foster MRN: [id number] Date of Birth: [birth year]   Medicare Observation Status Notification Given:  Yes    Lawerance Sabal, RN 11/17/2022, 9:14 AM

## 2022-11-17 NOTE — Plan of Care (Signed)

## 2022-11-17 NOTE — Progress Notes (Signed)
EEG complete - results pending 

## 2022-11-17 NOTE — Progress Notes (Signed)
OT Cancellation Note  Patient Details Name: Frank Foster MRN: [id number] DOB: 10/15/1945   Cancelled Treatment:    Reason Eval/Treat Not Completed: Patient at procedure or test/ unavailable (echo)  Donia Pounds 11/17/2022, 9:59 AM

## 2022-11-17 NOTE — Discharge Instructions (Signed)

## 2022-11-17 NOTE — Progress Notes (Signed)
08:40H Patient send for 2D echo via wheelchair, accompanied by transport service.

## 2022-11-17 NOTE — Progress Notes (Signed)
  Echocardiogram 2D Echocardiogram has been performed.  Frank Foster 11/17/2022, 9:39 AM

## 2022-11-17 NOTE — Progress Notes (Addendum)
STROKE TEAM PROGRESS NOTE   BRIEF HPI Mr. Frank Foster is a 77 y.o. male with history of ilated cardiomyopathy, HFpEF 50 to 55% with grade 1 diastolic dysfunction, hypertension, hyperlipidemia, chronic anxiety/depression, gout presenting with disorientation, confusion.    INTERIM HISTORY/SUBJECTIVE MRI- negative for acute infarct EEG-negative Suspect TIA, plan for aspirin and Plavix for 3 weeks and then plavix alone.  Patient is hemodynamically stable and neurologically intact.   OBJECTIVE  CBC    Component Value Date/Time   WBC 5.7 11/16/2022 0937   RBC 4.75 11/16/2022 0937   HGB 15.5 11/16/2022 0937   HGB 15.6 01/16/2018 1147   HCT 46.1 11/16/2022 0937   HCT 44.7 01/16/2018 1147   PLT 179 11/16/2022 0937   PLT 241 01/16/2018 1147   MCV 97.1 11/16/2022 0937   MCV 92 01/16/2018 1147   MCH 32.6 11/16/2022 0937   MCHC 33.6 11/16/2022 0937   RDW 12.9 11/16/2022 0937   RDW 12.0 (L) 01/16/2018 1147   LYMPHSABS 1.4 11/16/2022 0937   LYMPHSABS 1.5 01/16/2018 1147   MONOABS 0.6 11/16/2022 0937   EOSABS 0.1 11/16/2022 0937   EOSABS 0.1 01/16/2018 1147   BASOSABS 0.0 11/16/2022 0937   BASOSABS 0.0 01/16/2018 1147    BMET    Component Value Date/Time   NA 141 11/16/2022 0937   NA 140 01/16/2018 1147   K 4.5 11/16/2022 0937   CL 103 11/16/2022 0937   CO2 31 11/16/2022 0937   GLUCOSE 126 (H) 11/16/2022 0937   BUN 17 11/16/2022 0937   BUN 13 01/16/2018 1147   CREATININE 1.11 11/16/2022 0937   CALCIUM 9.6 11/16/2022 0937   GFRNONAA >60 11/16/2022 0937    IMAGING past 24 hours CT HEAD CODE STROKE WO CONTRAST  Result Date: 11/16/2022 CLINICAL DATA:  Code stroke. Neuro deficit, acute, stroke suspected. Slurred speech and abnormal gait. EXAM: CT HEAD WITHOUT CONTRAST TECHNIQUE: Contiguous axial images were obtained from the base of the skull through the vertex without intravenous contrast. RADIATION DOSE REDUCTION: This exam was performed according to the departmental  dose-optimization program which includes automated exposure control, adjustment of the mA and/or kV according to patient size and/or use of iterative reconstruction technique. COMPARISON:  Head CT 12/02/2020 FINDINGS: Brain: There is no evidence of an acute infarct, intracranial hemorrhage, mass, midline shift, or extra-axial fluid collection. Mild cerebral atrophy is unchanged. Vascular: Calcified atherosclerosis at the skull base. No hyperdense vessel. Skull: No acute fracture or suspicious osseous lesion. Sinuses/Orbits: Visualized paranasal sinuses and mastoid air cells are clear. Bilateral cataract extraction. Other: None. ASPECTS ([place] Stroke Program Early CT Score) - Ganglionic level infarction (caudate, lentiform nuclei, internal capsule, insula, M1-M3 cortex): 7 - Supraganglionic infarction (M4-M6 cortex): 3 Total score (0-10 with 10 being normal): 10 These results were called by telephone at the time of interpretation on 11/16/2022 at 9:48 am to Dr. Alvester Chou , who verbally acknowledged these results. IMPRESSION: No evidence of acute intracranial abnormality. ASPECTS of 10. Electronically Signed   By: Sebastian Ache M.D.   On: 11/16/2022 10:04    Vitals:   11/16/22 2345 11/17/22 0345 11/17/22 0619 11/17/22 0724  BP: (!) 140/63 (!) 141/70  (!) 170/74  Pulse: 63 62  65  Resp: 18 18  18   Temp: 97.8 F (36.6 C) 97.9 F (36.6 C)  98.3 F (36.8 C)  TempSrc: Oral Oral  Oral  SpO2: 93% 95%  94%  Weight:   94.8 kg   Height:  PHYSICAL EXAM General:  Alert, well-nourished, well-developed patient in no acute distress Psych:  Mood and affect appropriate for situation CV: Regular rate and rhythm on monitor Respiratory:  Regular, unlabored respirations on room air GI: Abdomen soft and nontender   NEURO:  Mental Status: AA&Ox3, patient is able to give clear and coherent history Speech/Language: speech is without dysarthria or aphasia.  Naming, repetition, fluency, and comprehension  intact.  Cranial Nerves:  II: PERRL. Visual fields full.  III, IV, VI: EOMI. Eyelids elevate symmetrically.  V: Sensation is intact to light touch and symmetrical to face.  VII: Face is symmetrical resting and smiling VIII: hearing intact to voice. IX, X: Palate elevates symmetrically. Phonation is normal.  ZO:[id number] shrug 5/5. XII: tongue is midline without fasciculations. Motor: 5/5 strength to all muscle groups tested.  Tone: is normal and bulk is normal Sensation- Intact to light touch bilaterally. Extinction absent to light touch to DSS.   Coordination: FTN intact bilaterally, HKS: no ataxia in BLE.No drift.  Gait- deferred  ASSESSMENT/PLAN  Posterior circulation TIA  Code Stroke CT head No acute abnormality. ASPECTS 10.    CTA head & neck Pending  MRI no evidence of acute intracranial abdomen 2D Echo EF 50-55%, grade 1 diastolic dysfunction, bilateral atria normal in size EEG- This study is within normal limits. No seizures or epileptiform discharges were seen throughout the recording.  LDL 33 HgbA1c 6.0 VTE prophylaxis - Lovenox aspirin 81 mg daily prior to admission, now on aspirin 81 mg daily and clopidogrel 75 mg daily for 3 weeks and then plavix 75mg  alone. Therapy recommendations:  No follow up needed  Disposition: Home  Congestive Heart Failure Hypertension Home meds:  coreg, entresto Stable Blood Pressure Goal: BP less than 220/110   Hyperlipidemia Home meds:  crestor, resumed in hospital LDL 33, goal < 70 Continue statin at discharge  Other Stroke Risk Factors Obstructive sleep apnea Recommend outpatient sleep study  Other Active Problems Gout Allopurinol continued  Anxiety/depression Zoloft  Hospital day # 0  Patient seen and examined by NP/APP with MD. MD to update note as needed.   Elmer Picker, DNP, FNP-BC [facility] Pager: [phone]  STROKE MD NOTE ; I have personally obtained history,examined this patient, reviewed  notes, independently viewed imaging studies, participated in medical decision making and plan of care.ROS completed by me personally and pertinent positives fully documented  I have made any additions or clarifications directly to the above note. Agree with note above.  Patient presented with sudden onset of dysarthria and speech difficulties along with gait ataxia likely posterior circulation TIA with negative neurovascular imaging.  Recommend aspirin and Plavix for 3 weeks followed by Plavix alone and aggressive risk factor modification.  Long discussion with patient and answered questions.  Discussed with Dr. Waymon Amato.  Greater than 50% time during this 50-minute visit was spent on counseling and coordination of care about TIA and discussion about risk for recurrent strokes and TIAs stroke prevention and answering questions.  Delia Heady, MD Medical Director [facility] Pager: [phone] 11/17/2022 5:07 PM   To contact Stroke Continuity provider, please refer to [email or web address]. After hours, contact General Neurology

## 2022-11-17 NOTE — Progress Notes (Signed)
17:09H AVS reviewed and discussed with patient. Discharged ambulatory accompanied by wife. Refused to use wheelchair.

## 2022-11-27 DIAGNOSIS — E041 Nontoxic single thyroid nodule: Secondary | ICD-10-CM | POA: Diagnosis not present

## 2022-11-27 DIAGNOSIS — Z0000 Encounter for general adult medical examination without abnormal findings: Secondary | ICD-10-CM | POA: Diagnosis not present

## 2022-11-27 DIAGNOSIS — G459 Transient cerebral ischemic attack, unspecified: Secondary | ICD-10-CM | POA: Diagnosis not present

## 2022-11-27 DIAGNOSIS — Z23 Encounter for immunization: Secondary | ICD-10-CM | POA: Diagnosis not present

## 2022-11-27 DIAGNOSIS — Z1339 Encounter for screening examination for other mental health and behavioral disorders: Secondary | ICD-10-CM | POA: Diagnosis not present

## 2022-11-27 DIAGNOSIS — I77810 Thoracic aortic ectasia: Secondary | ICD-10-CM | POA: Diagnosis not present

## 2022-11-27 DIAGNOSIS — R5383 Other fatigue: Secondary | ICD-10-CM | POA: Diagnosis not present

## 2022-11-27 DIAGNOSIS — Z1331 Encounter for screening for depression: Secondary | ICD-10-CM | POA: Diagnosis not present

## 2022-12-05 ENCOUNTER — Other Ambulatory Visit: Payer: Self-pay | Admitting: Family Medicine

## 2022-12-05 DIAGNOSIS — E041 Nontoxic single thyroid nodule: Secondary | ICD-10-CM

## 2022-12-06 DIAGNOSIS — K219 Gastro-esophageal reflux disease without esophagitis: Secondary | ICD-10-CM | POA: Diagnosis not present

## 2022-12-06 DIAGNOSIS — E041 Nontoxic single thyroid nodule: Secondary | ICD-10-CM | POA: Diagnosis not present

## 2022-12-06 DIAGNOSIS — R197 Diarrhea, unspecified: Secondary | ICD-10-CM | POA: Diagnosis not present

## 2022-12-06 DIAGNOSIS — G459 Transient cerebral ischemic attack, unspecified: Secondary | ICD-10-CM | POA: Diagnosis not present

## 2022-12-07 ENCOUNTER — Ambulatory Visit
Admission: RE | Admit: 2022-12-07 | Discharge: 2022-12-07 | Disposition: A | Discharge status: Home / Self Care | Payer: PPO | Source: Ambulatory Visit | Attending: Family Medicine | Admitting: Family Medicine

## 2022-12-07 DIAGNOSIS — E042 Nontoxic multinodular goiter: Secondary | ICD-10-CM | POA: Diagnosis not present

## 2022-12-07 DIAGNOSIS — E041 Nontoxic single thyroid nodule: Secondary | ICD-10-CM

## 2022-12-20 ENCOUNTER — Ambulatory Visit: Payer: PPO | Admitting: Adult Health

## 2022-12-20 ENCOUNTER — Encounter: Payer: Self-pay | Admitting: Adult Health

## 2022-12-20 VITALS — BP 148/69 | HR 59 | Ht 70.0 in | Wt 205.0 lb

## 2022-12-20 DIAGNOSIS — G459 Transient cerebral ischemic attack, unspecified: Secondary | ICD-10-CM | POA: Diagnosis not present

## 2022-12-20 NOTE — Patient Instructions (Addendum)
Continue clopidogrel 75 mg daily (Plavix)  and Crestor 5 mg daily for secondary stroke prevention  Continue to follow up with PCP regarding blood pressure, cholesterol and pre-diabetes management  Maintain strict control of hypertension with blood pressure goal below 130/90, pre-diabetes with hemoglobin A1c goal below 7.0 % and cholesterol with LDL cholesterol (bad cholesterol) goal below 70 mg/dL.   Signs of a Stroke? Follow the BEFAST method:  Balance Watch for a sudden loss of balance, trouble with coordination or vertigo Eyes Is there a sudden loss of vision in one or both eyes? Or double vision?  Face: Ask the person to smile. Does one side of the face droop or is it numb?  Arms: Ask the person to raise both arms. Does one arm drift downward? Is there weakness or numbness of a leg? Speech: Ask the person to repeat a simple phrase. Does the speech sound slurred/strange? Is the person confused ? Time: If you observe any of these signs, call 911.        Thank you for coming to see [place] at [facility]. I hope we have been able to provide you high quality care today.  You may receive a patient satisfaction survey over the next few weeks. We would appreciate your feedback and comments so that we may continue to improve ourselves and the health of our patients.    Transient Ischemic Attack A transient ischemic attack (TIA) causes the same symptoms as a stroke, but the symptoms go away quickly. A TIA happens when blood flow to the brain is blocked. Having a TIA means you may be at risk for a stroke. A TIA is a medical emergency. What are the causes? A TIA is caused by a blocked artery in the head or neck. This means the brain does not get the blood supply it needs. A blockage can be caused by: Fatty buildup in an artery in the head or neck. A blood clot. A tear in an artery. Irritation and swelling (inflammation) of an artery. Sometimes the cause is not known. What  increases the risk? Certain things may make you more likely to have a TIA. Some of these are things that you can change, such as: Using products that have nicotine or tobacco. Not being active. Drinking too much alcohol. Using recreational drugs. Health conditions that may increase your risk include: High blood pressure. High cholesterol. Diabetes. Heart disease. A heartbeat that is not regular (atrial fibrillation). Sickle cell disease. Problems with blood clotting. Other risk factors include: Being over the age of 33. Being male. Being very overweight. Sleep problems (sleep apnea). Having a family history of stroke. Having had blood clots, stroke, TIA, or heart attack in the past. What are the signs or symptoms? The symptoms of a TIA are like those of a stroke. They can include: Weakness or loss of feeling in your face, arm, or leg. This often happens on one side of your body. Trouble walking. Trouble moving your arms or legs. Trouble talking or understanding what people are saying. Problems with how you see. Feeling dizzy. Feeling confused. Loss of balance or coordination. Feeling like you may vomit (nausea) or vomiting. Having a very bad headache. If you can, note what time you started to have symptoms. Tell your doctor. How is this treated? The goal of treatment is to lower the risk for a stroke. This may include: Changes to diet and lifestyle, such as getting regular exercise and stopping smoking. Taking medicines to: Thin the  blood. Lower blood pressure. Lower cholesterol. Treating other health conditions, such as diabetes. If testing shows that an artery in your brain is narrow, your doctor may recommend a procedure to: Take the blockage out of your artery. Open or widen an artery in your neck (carotid angioplasty and stenting). Follow these instructions at home: Medicines Take over-the-counter and prescription medicines only as told by your doctor. If you were  told to take aspirin or another medicine to thin your blood, use it exactly as told by your doctor. Taking too much of the medicine can cause bleeding. Taking too little of the medicine may not work to treat the problem. Eating and drinking  Eat 5 or more servings of fruits and vegetables each day. Follow instructions from your doctor about your diet. You may need to follow a certain diet to help lower your risk of a stroke. You may need to: Eat a diet that is low in fat and salt. Eat foods with a lot of fiber. Limit carbohydrates and sugar. If you drink alcohol: Limit how much you have to: 0-1 drink a day for women who are not pregnant. 0-2 drinks a day for men. Know how much alcohol is in a drink. In the [place] one drink equals one 12 oz bottle of beer (355 mL), one 5 oz glass of wine (148 mL), or one 1 oz glass of hard liquor (44 mL). General instructions Keep a healthy weight. Try to get at least 30 minutes of exercise on most days. Get treatment if you have sleep problems. Do not smoke or use any products that contain nicotine or tobacco. If you need help quitting, ask your doctor. Do not use drugs. Keep all follow-up visits. Your doctor will want to know if you have any more symptoms and to check blood labs if any medicines were prescribed. Where to find more information American Stroke Association: stroke.org Get help right away if: You have chest pain. You have a heartbeat that is not regular. You have any signs of a stroke. "BE FAST" is an easy way to remember the main warning signs: B - Balance. Dizziness, sudden trouble walking, or loss of balance. E - Eyes. Trouble seeing or a change in how you see. F - Face. Sudden weakness or loss of feeling of the face. The face or eyelid may droop on one side. A - Arms. Weakness or loss of feeling in an arm. This happens all of a sudden and most often on one side of the body. S - Speech. Sudden trouble speaking, slurred speech, or  trouble understanding what people say. T - Time. Time to call emergency services. Write down what time symptoms started. You have other signs of a stroke, such as: A sudden, very bad headache with no known cause. Feeling like you may vomit. Vomiting. A seizure. These symptoms may be an emergency. Get help right away. Call 911. Do not wait to see if the symptoms will go away. Do not drive yourself to the hospital. This information is not intended to replace advice given to you by your health care provider. Make sure you discuss any questions you have with your health care provider. Document Revised: 06/30/2021 Document Reviewed: 06/30/2021 Elsevier Patient Education  2024 [employer].

## 2022-12-20 NOTE — Progress Notes (Signed)
I agree with the above plan 

## 2022-12-20 NOTE — Progress Notes (Signed)
[facility] [address] [place] [place] [address] [phone]       HOSPITAL FOLLOW UP NOTE  [name] Date of Birth:  Jan 22, 1946 Medical Record Number:  [id number]   Reason for Referral:  hospital stroke follow up   SUBJECTIVE:   CHIEF COMPLAINT:  Chief Complaint  Patient presents with   Follow-up    Patient in room #2 and alone. Patient states is well but would like to know if he could take Aspirin and not Plavix?    HPI:   [name] is a 77 y.o. male with history of dilated cardiomyopathy, HFpEF 50 to 55% with grade 1 diastolic dysfunction, hypertension, hyperlipidemia, chronic anxiety/depression, gout who presented to ED on 11/16/2022 with dysarthria and gait ataxia which resolved while in ED.  CTH and MRI brain negative for acute abnormality.  CTA head/neck negative LVO or hemodynamically significant stenosis, also incidental finding of 1.8 cm left thyroid nodule and recommended outpatient ultrasound via PCP.  EEG normal without evidence of seizures.  Suspected symptoms in setting of posterior circulation TIA.  Recommended DAPT for 3 weeks then Plavix alone and continuation of Crestor, LDL 33.  A1c 6.0 indicating pre-DM.  Noted other stroke risk factors of OSA and recommended outpatient sleep study.  Evaluated by therapies without therapy needs and discharged home.   Today, 12/20/2022, patient is being seen for initial hospital follow-up unaccompanied.  He has been stable without new or reoccurring stroke/TIA symptoms.  Has returned back to all prior activities without difficulty.  Has completed 3 weeks DAPT, remains on Plavix alone as well as Crestor.  He questions if he can return back to aspirin and stop Plavix, was initially have issues with diarrhea but this has since resolved, denies any other side effects. Blood pressure monitored at home, typically 120-130s/60-70s.  Routinely follows with PCP Dr. Duanne Guess for stroke risk factor  management.  Completed thyroid ultrasound which showed left thyroid 2.8 cm nodule and right thyroid 1.0 cm nodule, recommended annual ultrasound surveillance monitoring.  No further questions or concerns at this time.      PERTINENT IMAGING  Code Stroke CT head No acute abnormality. ASPECTS 10.    CTA head & neck No emergent large vessel occlusion or proximal hemodynamically significant stenosis.  Incidental finding of 1.8 cm left thyroid nodule.  MRI no evidence of acute intracranial abdomen 2D Echo EF 50-55%, grade 1 diastolic dysfunction, bilateral atria normal in size EEG- This study is within normal limits. No seizures or epileptiform discharges were seen throughout the recording.  LDL 33 HgbA1c 6.0    ROS:   14 system review of systems performed and negative with exception of those listed in HPI  PMH:  Past Medical History:  Diagnosis Date   Body mass index (bmi) 28.0-28.9, adult    Gout    Hyperlipidemia    Hypertension     PSH:  Past Surgical History:  Procedure Laterality Date   broken nose     CHOLECYSTECTOMY N/A 08/28/2019   Procedure: LAPAROSCOPIC CHOLECYSTECTOMY WITH INTRAOPERATIVE CHOLANGIOGRAM;  Surgeon: Luretha Murphy, MD;  Location: [facility];  Service: General;  Laterality: N/A;   KNEE SURGERY     arthroscopic right knee   RIGHT/LEFT HEART CATH AND CORONARY ANGIOGRAPHY N/A 01/23/2018   Procedure: RIGHT/LEFT HEART CATH AND CORONARY ANGIOGRAPHY;  Surgeon: Kathleene Hazel, MD;  Location: [facility] INVASIVE CV LAB;  Service: Cardiovascular;  Laterality: N/A;    Social History:  Social History  Socioeconomic History   Marital status: Married    Spouse name: Not on file   Number of children: Not on file   Years of education: Not on file   Highest education level: Not on file  Occupational History   Not on file  Tobacco Use   Smoking status: Former    Current packs/day: 0.00    Types: Cigarettes    Quit date: 04/29/2013    Years since quitting: 9.6    Smokeless tobacco: Never  Vaping Use   Vaping status: Never Used  Substance and Sexual Activity   Alcohol use: Yes    Alcohol/week: 5.0 standard drinks of alcohol    Types: 3 Glasses of wine, 2 Shots of liquor per week   Drug use: No   Sexual activity: Not on file  Other Topics Concern   Not on file  Social History Narrative   Not on file   Social Determinants of Health   Financial Resource Strain: Not on file  Food Insecurity: No Food Insecurity (11/16/2022)   Hunger Vital Sign    Worried About Running Out of Food in the Last Year: Never true    Ran Out of Food in the Last Year: Never true  Transportation Needs: No Transportation Needs (11/16/2022)   PRAPARE - Administrator, Civil Service (Medical): No    Lack of Transportation (Non-Medical): No  Physical Activity: Not on file  Stress: Not on file  Social Connections: Unknown (06/13/2021)   Received [facility]   Social Network    Social Network: Not on file  Intimate Partner Violence: Not At Risk (11/16/2022)   Humiliation, Afraid, Rape, and Kick questionnaire    Fear of Current or Ex-Partner: No    Emotionally Abused: No    Physically Abused: No    Sexually Abused: No    Family History:  Family History  Problem Relation Age of Onset   Heart disease Mother    Prostate cancer Father    Heart disease Father     Medications:   Current Outpatient Medications on File Prior to Visit  Medication Sig Dispense Refill   allopurinol (ZYLOPRIM) 100 MG tablet Take 100 mg by mouth every evening.      carvedilol (COREG) 12.5 MG tablet Take 12.5 mg by mouth 2 (two) times daily with a meal.     Cholecalciferol (VITAMIN D-3 PO) Take 1 capsule by mouth daily at 6 (six) AM.     clopidogrel (PLAVIX) 75 MG tablet Take 1 tablet (75 mg total) by mouth daily. 90 tablet 0   rosuvastatin (CRESTOR) 5 MG tablet Take 5 mg by mouth every evening.      sacubitril-valsartan (ENTRESTO) 97-103 MG Take 1 tablet  by mouth 2 (two) times daily. 180 tablet 3   sertraline (ZOLOFT) 25 MG tablet Take 25 mg by mouth daily.     zolpidem (AMBIEN) 10 MG tablet Take 10 mg by mouth at bedtime.     acetaminophen (TYLENOL) 325 MG tablet Take 650 mg by mouth every 6 (six) hours as needed for mild pain or headache. (Patient not taking: Reported on 12/20/2022)     pantoprazole (PROTONIX) 40 MG tablet Take 40 mg by mouth daily. (Patient not taking: Reported on 12/20/2022)     sildenafil (VIAGRA) 50 MG tablet Take 50-100 mg by mouth daily as needed for erectile dysfunction. (Patient not taking: Reported on 12/20/2022)     No current facility-administered medications on file prior to visit.  Allergies:   Allergies  Allergen Reactions   Penicillins Nausea And Vomiting    Severe vomiting! DID THE REACTION INVOLVE: Swelling of the face/tongue/throat, SOB, or low BP? No Sudden or severe rash/hives, skin peeling, or the inside of the mouth or nose? No Did it require medical treatment? No When did it last happen?      childhood allergy If all above answers are "NO", may proceed with cephalosporin use.       OBJECTIVE:  Physical Exam  Vitals:   12/20/22 0841  BP: (!) 148/69  Pulse: (!) 59  Weight: 205 lb (93 kg)  Height: 5\' 10"  (1.778 m)   Body mass index is 29.41 kg/m. No results found.   General: well developed, well nourished, very pleasant elderly Caucasian male, seated, in no evident distress  Neurologic Exam Mental Status: Awake and fully alert.  Fluent speech and language.  Oriented to place and time. Recent and remote memory intact. Attention span, concentration and fund of knowledge appropriate. Mood and affect appropriate.  Cranial Nerves: Pupils equal, briskly reactive to light. Extraocular movements full without nystagmus. Visual fields full to confrontation. Hearing intact. Facial sensation intact. Face, tongue, palate moves normally and symmetrically.  Motor: Normal bulk and tone. Normal  strength in all tested extremity muscles Sensory.: intact to touch , pinprick , position and vibratory sensation.  Coordination: Rapid alternating movements normal in all extremities. Finger-to-nose and heel-to-shin performed accurately bilaterally. Gait and Station: Arises from chair without difficulty. Stance is normal. Gait demonstrates normal stride length and balance without use of AD.  Reflexes: 1+ and symmetric. Toes downgoing.     NIHSS  0 Modified Rankin  0      ASSESSMENT: Frank Foster is a 77 y.o. year old male with likely posterior circulation TIA on 11/16/2022 after presenting with dysarthria and gait ataxia. Vascular risk factors include HTN, HLD, pre-DM, CHF and possible OSA.      PLAN:  TIA:  Continue Plavix and rosuvastatin (Crestor) 5mg  daily for secondary stroke prevention.  Discussed indication for Plavix, he was taking aspirin 81 mg daily prior to TIA which could be considered in aspirin failure, as he is tolerating Plavix well currently, I would recommend he remain on Plavix for life unless contraindicated in the future. Advised refills for both Plavix and Crestor can be obtained by PCP Discussed secondary stroke prevention measures and importance of close PCP follow up for aggressive stroke risk factor management including BP goal<130/90, HLD with LDL goal<70 and DM with A1c.<7 .  Stroke labs 10/2022: LDL 33, A1c 6.0 I have gone over the pathophysiology of stroke, warning signs and symptoms, risk factors and their management in some detail with instructions to go to the closest emergency room for symptoms of concern.     Doing well from stroke standpoint without further recommendations and risk factors are managed by PCP. He may follow up PRN, as usual for our patients who are strictly being followed for stroke. If any new neurological issues should arise, request PCP place referral for evaluation by one of our neurologists. Thank you.     CC:  [facility]  provider: Dr. Pearlean Brownie PCP: Lewis Moccasin, MD    I spent 50 minutes of face-to-face and non-face-to-face time with patient.  This included previsit chart review including review of recent hospitalization, lab review, study review, electronic health record documentation, patient education and discussion regarding above diagnoses and treatment plan and answered all other questions to patient's satisfaction   Shanda Bumps  Nigel Bridgeman  [facility] [address] [place], [place] [address]  Phone [phone] Fax [phone] Note: This document was prepared with digital dictation and possible smart phrase technology. Any transcriptional errors that result from this process are unintentional.

## 2022-12-21 ENCOUNTER — Other Ambulatory Visit: Payer: Self-pay | Admitting: Family Medicine

## 2022-12-21 DIAGNOSIS — E041 Nontoxic single thyroid nodule: Secondary | ICD-10-CM

## 2023-01-07 ENCOUNTER — Telehealth: Payer: Self-pay | Admitting: Cardiovascular Disease

## 2023-01-07 DIAGNOSIS — Z0000 Encounter for general adult medical examination without abnormal findings: Secondary | ICD-10-CM | POA: Diagnosis not present

## 2023-01-07 DIAGNOSIS — E041 Nontoxic single thyroid nodule: Secondary | ICD-10-CM | POA: Diagnosis not present

## 2023-01-07 NOTE — Telephone Encounter (Signed)
Left voicemail to return call to office.

## 2023-01-07 NOTE — Telephone Encounter (Signed)
Pt c/o medication issue:  1. Name of Medication:   clopidogrel (PLAVIX) 75 MG tablet   2. How are you currently taking this medication (dosage and times per day)?   3. Are you having a reaction (difficulty breathing--STAT)?   4. What is your medication issue?   Caller (Arlen) stated patient wants a call back directly to discuss instructions for this medication.

## 2023-01-15 DIAGNOSIS — Z136 Encounter for screening for cardiovascular disorders: Secondary | ICD-10-CM | POA: Diagnosis not present

## 2023-01-15 DIAGNOSIS — F17210 Nicotine dependence, cigarettes, uncomplicated: Secondary | ICD-10-CM | POA: Diagnosis not present

## 2023-01-15 NOTE — Telephone Encounter (Signed)
Called and spoke with patient this morning who states that every now and then he is coughing up blood and sometimes he blows his nose and sees drops of blood come out. States this morning he awoke and had dried blood on his pillow and face. He wonders if it would be reasonable for him to stop the Clopidogrel and resume Aspirin 81mg  daily. Pt is followed here by [facility], has carotid plaque,but no stenosis and no CAD on last cath in 2019. Pt seen in ED on 11/16/22 for TIA symptoms and per chart: CT head code stroke: No evidence of acute intracranial abnormality.  Aspects of 10. CTA of head and neck: No emergent large vessel occlusion or proximal hemodynamically significant stenosis. MRI brain: No evidence of acute intracranial abnormality. Neurology consultation appreciated.  They recommend aspirin 81 Mg daily + Plavix 75 Mg daily x 3 weeks followed by Plavix 75 Mg alone.  Continue home dose of statin. Outpatient neurology follow-up. Informed pt that I would speak with [facility] about this, but may ultimately have to seek neurology input. Educated that what he describes is not abnormal and is ultimately an aggravation, but not a real issue, so decision may be to remain on it and use humidifier and nasal saline. Pt understands.

## 2023-01-17 NOTE — Telephone Encounter (Signed)
Frank Stade, MD sent to [name], RN Caller: Unspecified (1 week ago) Up to neurology they had him on DAT for TIA no reason for it from cardiac perspective   Returned call to patient and made him aware of Frank Foster's comments. He will call neurologist that he followed up with after his hospitalization.

## 2023-02-12 ENCOUNTER — Other Ambulatory Visit (HOSPITAL_COMMUNITY)
Admission: RE | Admit: 2023-02-12 | Discharge: 2023-02-12 | Disposition: A | Discharge status: Home / Self Care | Payer: PPO | Source: Ambulatory Visit | Attending: Interventional Radiology | Admitting: Interventional Radiology

## 2023-02-12 ENCOUNTER — Ambulatory Visit
Admission: RE | Admit: 2023-02-12 | Discharge: 2023-02-12 | Disposition: A | Discharge status: Home / Self Care | Payer: PPO | Source: Ambulatory Visit | Attending: Family Medicine | Admitting: Family Medicine

## 2023-02-12 DIAGNOSIS — E041 Nontoxic single thyroid nodule: Secondary | ICD-10-CM | POA: Insufficient documentation

## 2023-02-14 LAB — CYTOLOGY - NON PAP

## 2023-02-15 DIAGNOSIS — J329 Chronic sinusitis, unspecified: Secondary | ICD-10-CM | POA: Diagnosis not present

## 2023-02-15 DIAGNOSIS — J069 Acute upper respiratory infection, unspecified: Secondary | ICD-10-CM | POA: Diagnosis not present

## 2023-02-15 DIAGNOSIS — B9689 Other specified bacterial agents as the cause of diseases classified elsewhere: Secondary | ICD-10-CM | POA: Diagnosis not present

## 2023-02-21 DIAGNOSIS — J329 Chronic sinusitis, unspecified: Secondary | ICD-10-CM | POA: Diagnosis not present

## 2023-02-21 DIAGNOSIS — F331 Major depressive disorder, recurrent, moderate: Secondary | ICD-10-CM | POA: Diagnosis not present

## 2023-02-21 DIAGNOSIS — F411 Generalized anxiety disorder: Secondary | ICD-10-CM | POA: Diagnosis not present

## 2023-02-21 DIAGNOSIS — Z1159 Encounter for screening for other viral diseases: Secondary | ICD-10-CM | POA: Diagnosis not present

## 2023-02-21 DIAGNOSIS — G4700 Insomnia, unspecified: Secondary | ICD-10-CM | POA: Diagnosis not present

## 2023-02-21 DIAGNOSIS — J069 Acute upper respiratory infection, unspecified: Secondary | ICD-10-CM | POA: Diagnosis not present

## 2023-02-21 DIAGNOSIS — I10 Essential (primary) hypertension: Secondary | ICD-10-CM | POA: Diagnosis not present

## 2023-02-21 DIAGNOSIS — B9689 Other specified bacterial agents as the cause of diseases classified elsewhere: Secondary | ICD-10-CM | POA: Diagnosis not present

## 2023-03-12 DIAGNOSIS — G4700 Insomnia, unspecified: Secondary | ICD-10-CM | POA: Diagnosis not present

## 2023-03-12 DIAGNOSIS — F331 Major depressive disorder, recurrent, moderate: Secondary | ICD-10-CM | POA: Diagnosis not present

## 2023-03-12 DIAGNOSIS — F411 Generalized anxiety disorder: Secondary | ICD-10-CM | POA: Diagnosis not present

## 2023-03-12 DIAGNOSIS — R61 Generalized hyperhidrosis: Secondary | ICD-10-CM | POA: Diagnosis not present

## 2023-03-12 DIAGNOSIS — F410 Panic disorder [episodic paroxysmal anxiety] without agoraphobia: Secondary | ICD-10-CM | POA: Diagnosis not present

## 2023-03-12 DIAGNOSIS — I10 Essential (primary) hypertension: Secondary | ICD-10-CM | POA: Diagnosis not present

## 2023-03-20 ENCOUNTER — Encounter: Payer: Self-pay | Admitting: Cardiology

## 2023-03-20 ENCOUNTER — Ambulatory Visit: Payer: PPO | Attending: Cardiology | Admitting: Cardiology

## 2023-03-20 VITALS — BP 120/70 | HR 83 | Ht 70.0 in | Wt 201.8 lb

## 2023-03-20 DIAGNOSIS — I447 Left bundle-branch block, unspecified: Secondary | ICD-10-CM

## 2023-03-20 DIAGNOSIS — I2510 Atherosclerotic heart disease of native coronary artery without angina pectoris: Secondary | ICD-10-CM

## 2023-03-20 DIAGNOSIS — I10 Essential (primary) hypertension: Secondary | ICD-10-CM

## 2023-03-20 DIAGNOSIS — E785 Hyperlipidemia, unspecified: Secondary | ICD-10-CM

## 2023-03-20 DIAGNOSIS — I420 Dilated cardiomyopathy: Secondary | ICD-10-CM

## 2023-03-20 MED ORDER — SACUBITRIL-VALSARTAN 97-103 MG PO TABS: 1.0000 | ORAL_TABLET | Freq: Two times a day (BID) | ORAL | 3 refills | Status: AC

## 2023-03-20 NOTE — Patient Instructions (Signed)
Medication Instructions:  Your physician recommends that you continue on your current medications as directed. Please refer to the Current Medication list given to you today.  *If you need a refill on your cardiac medications before your next appointment, please call your pharmacy*   Lab Work: None ordered  If you have labs (blood work) drawn today and your tests are completely normal, you will receive your results only by: MyChart Message (if you have MyChart) OR A paper copy in the mail If you have any lab test that is abnormal or we need to change your treatment, we will call you to review the results.   Testing/Procedures: None ordered   Follow-Up: At [facility], you and your health needs are our priority.  As part of our continuing mission to provide you with exceptional heart care, we have created designated Provider Care Teams.  These Care Teams include your primary Cardiologist (physician) and Advanced Practice Providers (APPs -  Physician Assistants and Nurse Practitioners) who all work together to provide you with the care you need, when you need it.  We recommend signing up for the patient portal called "MyChart".  Sign up information is provided on this After Visit Summary.  MyChart is used to connect with patients for Virtual Visits (Telemedicine).  Patients are able to view lab/test results, encounter notes, upcoming appointments, etc.  Non-urgent messages can be sent to your provider as well.   To learn more about what you can do with MyChart, go to [email or web address].    Your next appointment:   1 year(s)  Provider:   Charlton Haws, MD     Other Instructions     1st Floor: - Lobby - Registration  - Pharmacy  - Lab - Cafe  2nd Floor: - PV Lab - Diagnostic Testing (echo, CT, nuclear med)  3rd Floor: - Vacant  4th Floor: - TCTS (cardiothoracic surgery) - AFib Clinic - Structural Heart Clinic - Vascular Surgery  - Vascular  Ultrasound  5th Floor: - HeartCare Cardiology (general and EP) - Clinical Pharmacy for coumadin, hypertension, lipid, weight-loss medications, and med management appointments    Valet parking services will be available as well.

## 2023-03-20 NOTE — Progress Notes (Signed)
Cardiology Office Note:   Date:  03/20/2023  ID:  Frank Foster, DOB Jul 05, 1945, MRN [id number] PCP: Lewis Moccasin, MD  [facility] Providers Cardiologist:  Charlton Haws, MD    History of Present Illness:   Discussed the use of AI scribe software for clinical note transcription with the patient, who gave verbal consent to proceed.  History of Present Illness   Frank Foster is a 78 year old male with coronary artery disease and dilated cardiomyopathy, HFrecEF who presents for follow-up after recent pneumonia. Appointment requested by his GP.  He has a history of coronary artery disease and dilated cardiomyopathy, initially referred to cardiology in 2019 due to coronary calcifications with a calcium score of 205. An ultrasound at that time showed a moderate reduction in left ventricular ejection fraction, and a stress test confirmed reduced EF with generalized hypokinesis. A left heart catheterization revealed mild nonobstructive coronary artery disease. No chest pain since his last visit and no issues with physical activity, attributing inactivity to lack of motivation and cold weather. No swelling in his legs, difficulty lying flat, or needing to prop himself up to sleep.  Three weeks ago, he developed symptoms of pneumonia, starting on a Wednesday. He was prescribed a Z-Pak on Friday, which was ineffective, and subsequently received an antibiotic injection the following Thursday, which resolved his symptoms. His breathing has returned to normal post-pneumonia.  He takes Plavix daily without issues such as bleeding, bloody urine, or stools. He also takes Entresto twice daily and Crestor without experiencing cramping or other side effects. His blood pressure at home is consistent with today's reading of 120/70.  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 His diet is rich in vegetables, chicken, and fish, with  limited red meat and fried foods. While patient is unclear on what prompted GP to request cardiology appointment, he mentions that they discussed episodes of morning sweating and he speculates this may have been what prompted today's visit. Per patient, this has been experiencing morning sweating after showering for several years, which he considers normal. No associated chest pain or shortness of breath.      ROS Summary: Today patient denies chest pain, shortness of breath, lower extremity edema, fatigue, palpitations, melena, hematuria, hemoptysis, diaphoresis, weakness, presyncope, syncope, orthopnea, and PND.   Studies Reviewed:    11/17/22 TTE  IMPRESSIONS     1. Left ventricular ejection fraction, by estimation, is 50 to 55%. The  left ventricle has low normal function. The left ventricle has no regional  wall motion abnormalities. Left ventricular diastolic parameters are  consistent with Grade I diastolic  dysfunction (impaired relaxation).   2. Right ventricular systolic function is normal. The right ventricular  size is normal. Tricuspid regurgitation signal is inadequate for assessing  PA pressure.   3. The mitral valve is normal in structure. Trivial mitral valve  regurgitation. No evidence of mitral stenosis.   4. The aortic valve is tricuspid. Aortic valve regurgitation is not  visualized. No aortic stenosis is present.   5. The inferior vena cava is normal in size with greater than 50%  respiratory variability, suggesting right atrial pressure of 3 mmHg.   6. Agitated saline contrast bubble study was negative, with no evidence  of any interatrial shunt.   Comparison(s): No significant change from prior study.   FINDINGS   Left Ventricle: Left ventricular ejection fraction, by estimation, is  50  to 55%. The left ventricle has low normal function. The left ventricle has  no regional wall motion abnormalities. The left ventricular internal  cavity size was normal in  size.  There is no left ventricular hypertrophy. Abnormal (paradoxical) septal  motion, consistent with left bundle branch block. Left ventricular  diastolic parameters are consistent with Grade I diastolic dysfunction  (impaired relaxation). Normal left  ventricular filling pressure.   Right Ventricle: The right ventricular size is normal. No increase in  right ventricular wall thickness. Right ventricular systolic function is  normal. Tricuspid regurgitation signal is inadequate for assessing PA  pressure.   Left Atrium: Left atrial size was normal in size.   Right Atrium: Right atrial size was normal in size.   Pericardium: There is no evidence of pericardial effusion.   Mitral Valve: The mitral valve is normal in structure. Trivial mitral  valve regurgitation. No evidence of mitral valve stenosis. MV peak  gradient, 4.2 mmHg. The mean mitral valve gradient is 2.0 mmHg.   Tricuspid Valve: The tricuspid valve is not well visualized. Tricuspid  valve regurgitation is not demonstrated. No evidence of tricuspid  stenosis.   Aortic Valve: The aortic valve is tricuspid. Aortic valve regurgitation is  not visualized. No aortic stenosis is present.   Pulmonic Valve: The pulmonic valve was not well visualized. Pulmonic valve  regurgitation is trivial. No evidence of pulmonic stenosis.   Aorta: The aortic root and ascending aorta are structurally normal, with  no evidence of dilitation.   Venous: The inferior vena cava is normal in size with greater than 50%  respiratory variability, suggesting right atrial pressure of 3 mmHg.   IAS/Shunts: No atrial level shunt detected by color flow Doppler. Agitated  saline contrast was given intravenously to evaluate for intracardiac  shunting. Agitated saline contrast bubble study was negative, with no  evidence of any interatrial shunt.   Risk Assessment/Calculations:              Physical Exam:   VS:  BP 120/70   Pulse 83   Ht 5'  10" (1.778 m)   Wt 201 lb 12.8 oz (91.5 kg)   SpO2 99%   BMI 28.96 kg/m    Wt Readings from Last 3 Encounters:  03/20/23 201 lb 12.8 oz (91.5 kg)  12/20/22 205 lb (93 kg)  11/17/22 208 lb 14.4 oz (94.8 kg)     Physical Exam Vitals reviewed.  Constitutional:      Appearance: Normal appearance.  HENT:     Head: Normocephalic.  Eyes:     Pupils: Pupils are equal, round, and reactive to light.  Cardiovascular:     Rate and Rhythm: Normal rate and regular rhythm.     Pulses: Normal pulses.     Heart sounds: Normal heart sounds.  Pulmonary:     Effort: Pulmonary effort is normal.     Breath sounds: Normal breath sounds.  Abdominal:     General: Abdomen is flat.     Palpations: Abdomen is soft.  Musculoskeletal:     Right lower leg: No edema.     Left lower leg: No edema.  Skin:    General: Skin is warm and dry.     Capillary Refill: Capillary refill takes less than 2 seconds.  Neurological:     General: No focal deficit present.     Mental Status: He is alert and oriented to person, place, and time.  Psychiatric:  Mood and Affect: Mood normal.        Behavior: Behavior normal.        Thought Content: Thought content normal.        Judgment: Judgment normal.     Physical Exam   VITALS: BP- 120/70 CHEST: Lungs clear to auscultation bilaterally.       ASSESSMENT AND PLAN:     Assessment and Plan    Coronary Artery Disease (CAD) Hyperlipidemia History of nonobstructive CAD with coronary calcifications on CT and calcium score of 205. Non-obstructive disease on LHC. No new symptoms of chest pain. LDL is well controlled at 33. -Continue current management with Plavix 75mg  and Crestor 5mg .  Dilated Cardiomyopathy HFrecEF Patient previously with LVEF 40% in 2019. As above, non-obstructive CAD. Has since had improvement in ejection fraction from 40% to 50-55% per 2024 TTE. No new symptoms of heart failure such as leg swelling, exertional limitations, or  orthopnea. -Continue current management with Carvedilol 12.5mg  BID and Entresto 97-103mg  BID.  Hypertension BP well controlled, 120/41mmHg today. -Continue Coreg 12.5mg  BID and Entresto 97-103mg  BID.   Recent Pneumonia Resolved with antibiotics. No residual symptoms or concerns. -No further action required at this time.  LBBB Longstanding LBBB. No symptoms suggestive of heart block/bradycardia.   General Health Maintenance Blood pressure well controlled. Patient reports a healthy diet with mostly vegetables and lean proteins. -Encourage continued adherence to current diet and medications. -Schedule follow-up appointment in 1 year.               Signed, Perlie Gold, PA-C

## 2023-03-27 DIAGNOSIS — D485 Neoplasm of uncertain behavior of skin: Secondary | ICD-10-CM | POA: Diagnosis not present

## 2023-03-27 DIAGNOSIS — D225 Melanocytic nevi of trunk: Secondary | ICD-10-CM | POA: Diagnosis not present

## 2023-03-27 DIAGNOSIS — D2272 Melanocytic nevi of left lower limb, including hip: Secondary | ICD-10-CM | POA: Diagnosis not present

## 2023-03-27 DIAGNOSIS — Z86018 Personal history of other benign neoplasm: Secondary | ICD-10-CM | POA: Diagnosis not present

## 2023-03-27 DIAGNOSIS — D0471 Carcinoma in situ of skin of right lower limb, including hip: Secondary | ICD-10-CM | POA: Diagnosis not present

## 2023-03-27 DIAGNOSIS — L821 Other seborrheic keratosis: Secondary | ICD-10-CM | POA: Diagnosis not present

## 2023-03-27 DIAGNOSIS — Z85828 Personal history of other malignant neoplasm of skin: Secondary | ICD-10-CM | POA: Diagnosis not present

## 2023-03-27 DIAGNOSIS — L814 Other melanin hyperpigmentation: Secondary | ICD-10-CM | POA: Diagnosis not present

## 2023-03-27 DIAGNOSIS — L578 Other skin changes due to chronic exposure to nonionizing radiation: Secondary | ICD-10-CM | POA: Diagnosis not present

## 2023-04-01 DIAGNOSIS — Z20822 Contact with and (suspected) exposure to covid-19: Secondary | ICD-10-CM | POA: Diagnosis not present

## 2023-04-01 DIAGNOSIS — Z1159 Encounter for screening for other viral diseases: Secondary | ICD-10-CM | POA: Diagnosis not present

## 2023-04-10 DIAGNOSIS — F411 Generalized anxiety disorder: Secondary | ICD-10-CM | POA: Diagnosis not present

## 2023-04-10 DIAGNOSIS — I10 Essential (primary) hypertension: Secondary | ICD-10-CM | POA: Diagnosis not present

## 2023-04-10 DIAGNOSIS — G4700 Insomnia, unspecified: Secondary | ICD-10-CM | POA: Diagnosis not present

## 2023-04-10 DIAGNOSIS — F331 Major depressive disorder, recurrent, moderate: Secondary | ICD-10-CM | POA: Diagnosis not present

## 2023-04-29 DIAGNOSIS — C44722 Squamous cell carcinoma of skin of right lower limb, including hip: Secondary | ICD-10-CM | POA: Diagnosis not present

## 2023-05-10 DIAGNOSIS — M109 Gout, unspecified: Secondary | ICD-10-CM | POA: Diagnosis not present

## 2023-05-10 DIAGNOSIS — R739 Hyperglycemia, unspecified: Secondary | ICD-10-CM | POA: Diagnosis not present

## 2023-05-13 DIAGNOSIS — R7303 Prediabetes: Secondary | ICD-10-CM | POA: Diagnosis not present

## 2023-05-13 DIAGNOSIS — I10 Essential (primary) hypertension: Secondary | ICD-10-CM | POA: Diagnosis not present

## 2023-05-13 DIAGNOSIS — G4700 Insomnia, unspecified: Secondary | ICD-10-CM | POA: Diagnosis not present

## 2023-05-13 DIAGNOSIS — M109 Gout, unspecified: Secondary | ICD-10-CM | POA: Diagnosis not present

## 2023-09-23 DIAGNOSIS — L03116 Cellulitis of left lower limb: Secondary | ICD-10-CM | POA: Diagnosis not present

## 2023-10-01 DIAGNOSIS — I429 Cardiomyopathy, unspecified: Secondary | ICD-10-CM | POA: Diagnosis not present

## 2023-10-01 DIAGNOSIS — Z23 Encounter for immunization: Secondary | ICD-10-CM | POA: Diagnosis not present

## 2023-10-01 DIAGNOSIS — L0390 Cellulitis, unspecified: Secondary | ICD-10-CM | POA: Diagnosis not present

## 2023-10-01 DIAGNOSIS — I509 Heart failure, unspecified: Secondary | ICD-10-CM | POA: Diagnosis not present

## 2023-10-01 DIAGNOSIS — E041 Nontoxic single thyroid nodule: Secondary | ICD-10-CM | POA: Diagnosis not present

## 2023-10-02 DIAGNOSIS — H35033 Hypertensive retinopathy, bilateral: Secondary | ICD-10-CM | POA: Diagnosis not present

## 2023-10-02 DIAGNOSIS — Z961 Presence of intraocular lens: Secondary | ICD-10-CM | POA: Diagnosis not present

## 2023-10-02 DIAGNOSIS — H524 Presbyopia: Secondary | ICD-10-CM | POA: Diagnosis not present

## 2023-10-02 DIAGNOSIS — H52223 Regular astigmatism, bilateral: Secondary | ICD-10-CM | POA: Diagnosis not present

## 2023-10-02 DIAGNOSIS — H353121 Nonexudative age-related macular degeneration, left eye, early dry stage: Secondary | ICD-10-CM | POA: Diagnosis not present

## 2023-10-02 DIAGNOSIS — H5213 Myopia, bilateral: Secondary | ICD-10-CM | POA: Diagnosis not present

## 2023-10-14 DIAGNOSIS — D2272 Melanocytic nevi of left lower limb, including hip: Secondary | ICD-10-CM | POA: Diagnosis not present

## 2023-10-14 DIAGNOSIS — Z85828 Personal history of other malignant neoplasm of skin: Secondary | ICD-10-CM | POA: Diagnosis not present

## 2023-10-14 DIAGNOSIS — Z86018 Personal history of other benign neoplasm: Secondary | ICD-10-CM | POA: Diagnosis not present

## 2023-10-14 DIAGNOSIS — L814 Other melanin hyperpigmentation: Secondary | ICD-10-CM | POA: Diagnosis not present

## 2023-10-14 DIAGNOSIS — L578 Other skin changes due to chronic exposure to nonionizing radiation: Secondary | ICD-10-CM | POA: Diagnosis not present

## 2023-10-14 DIAGNOSIS — D225 Melanocytic nevi of trunk: Secondary | ICD-10-CM | POA: Diagnosis not present

## 2023-10-14 DIAGNOSIS — L821 Other seborrheic keratosis: Secondary | ICD-10-CM | POA: Diagnosis not present

## 2023-10-17 DIAGNOSIS — Z23 Encounter for immunization: Secondary | ICD-10-CM | POA: Diagnosis not present

## 2023-10-17 DIAGNOSIS — Z7185 Encounter for immunization safety counseling: Secondary | ICD-10-CM | POA: Diagnosis not present

## 2023-10-17 DIAGNOSIS — I509 Heart failure, unspecified: Secondary | ICD-10-CM | POA: Diagnosis not present

## 2023-10-28 IMAGING — CT CT HEAD W/O CM
4 series · 17 of 47 positions shown, 19 images · non-contrast
Comparison: None.

CLINICAL DATA: Weakness.  Facial swelling after dental work.

EXAM:
CT HEAD WITHOUT CONTRAST
TECHNIQUE: Contiguous axial images were obtained from the base of the skull
through the vertex without intravenous contrast.

[Series 2: head wo · axial · 0.50mm/px · z∈[+1202,+1332]mm · 7 of 36 slices shown, 9 images]
[im 5/36  brain]
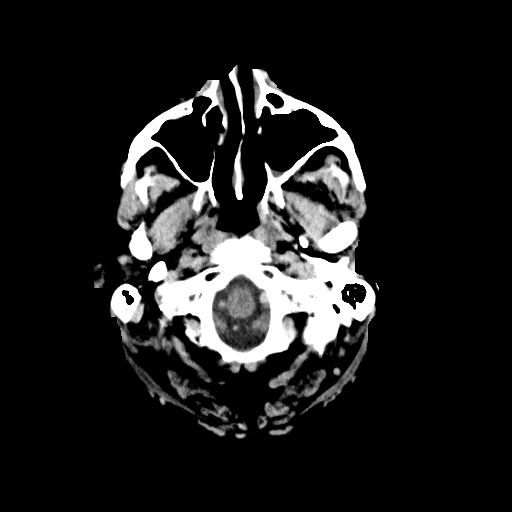
[im 5/36  bone]
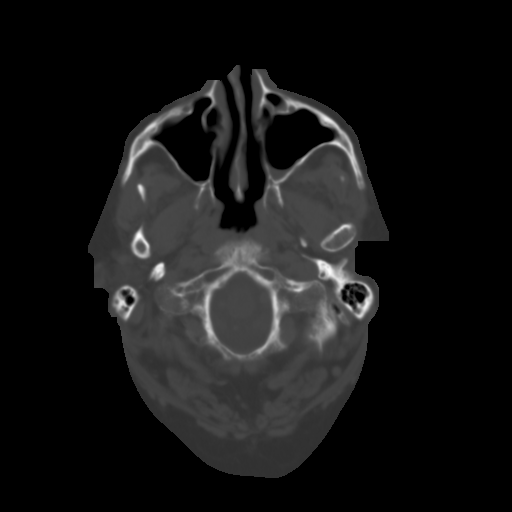
[im 9/36  brain]
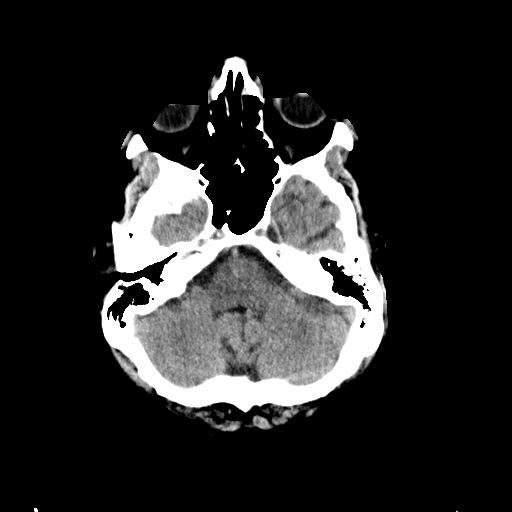
[im 14/36  brain]
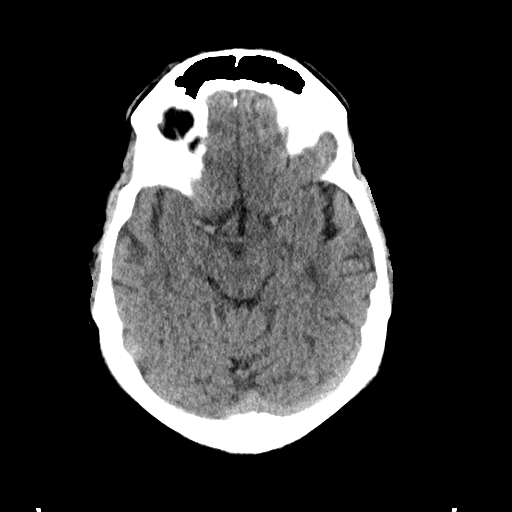
[im 18/36  brain]
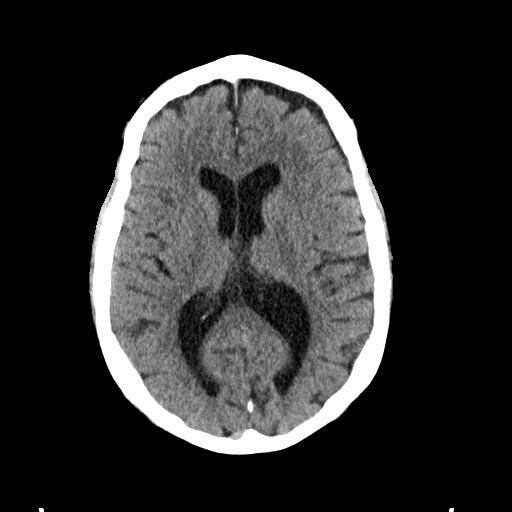
[im 22/36  brain]
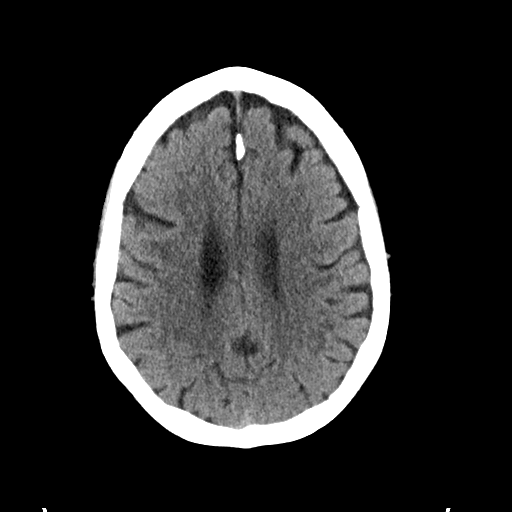
[im 22/36  bone]
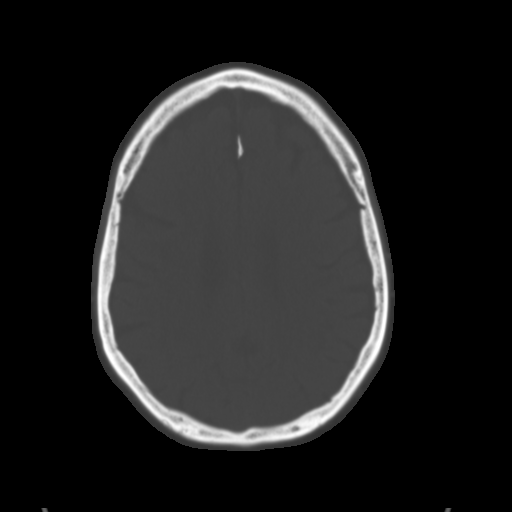
[im 27/36  brain]
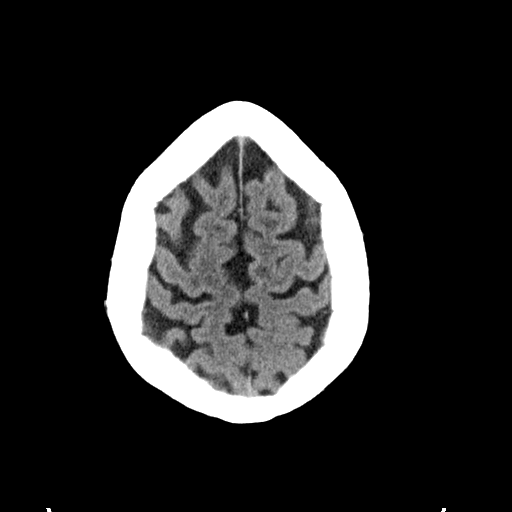
[im 31/36  brain]
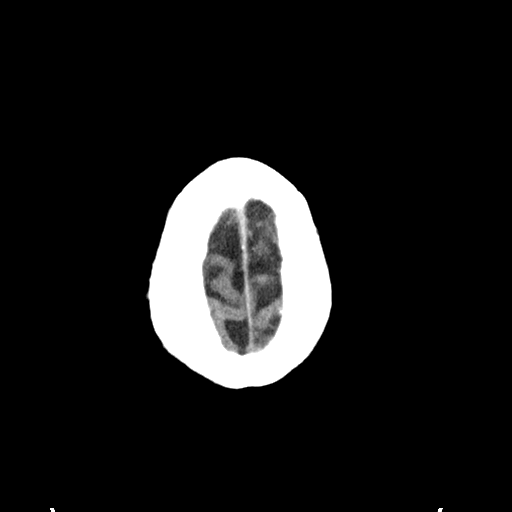

[Series 3: head bone · axial · 0.50mm/px · z∈[+1198,+1262]mm · 4 of 90 slices shown]
[im 9/90  bone]
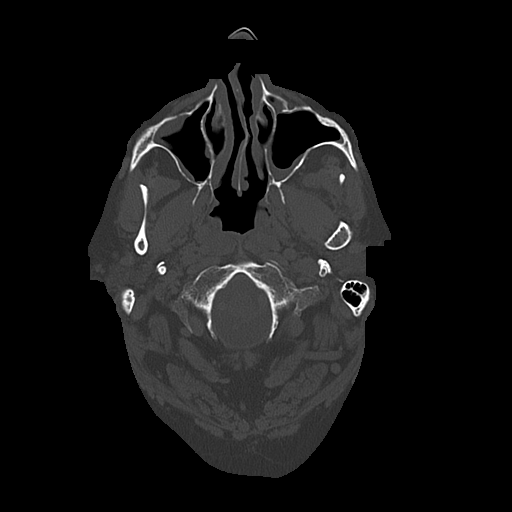
[im 18/90  bone]
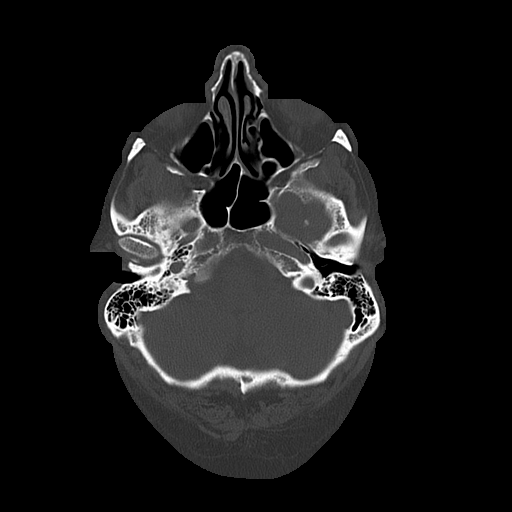
[im 27/90  bone]
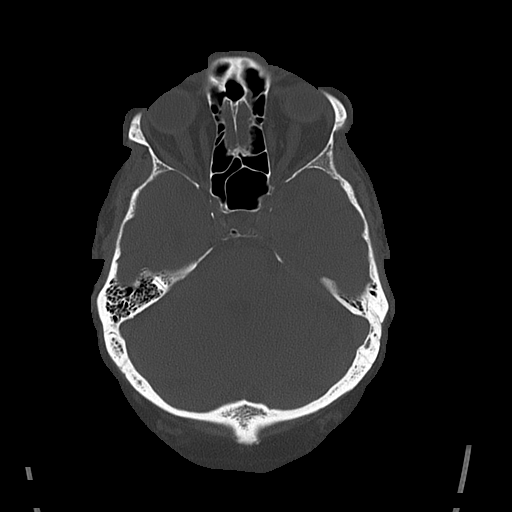
[im 41/90  bone]
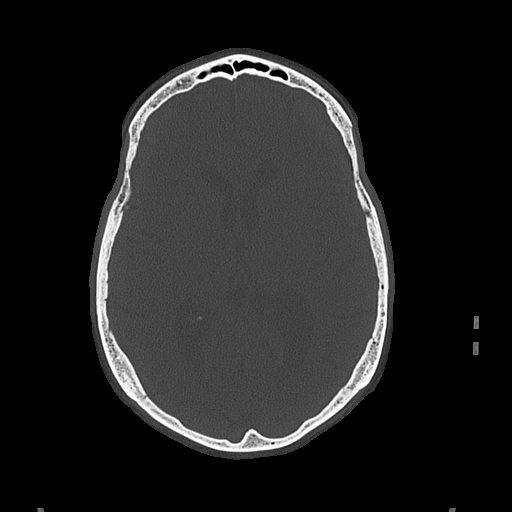

[Series 4: coronal soft · coronal · 0.37mm/px · 3 of 81 slices shown]
[im 27/81  brain]
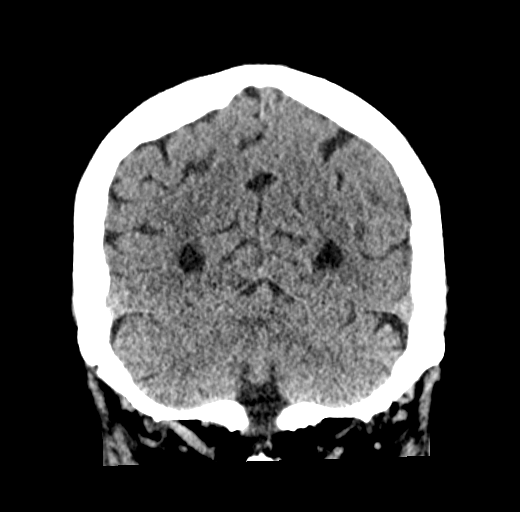
[im 36/81  brain]
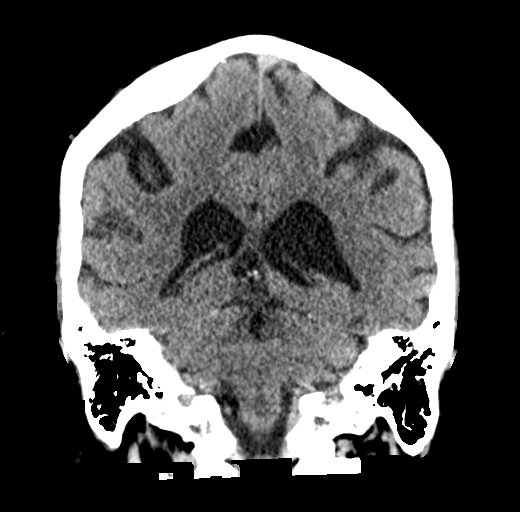
[im 45/81  brain]
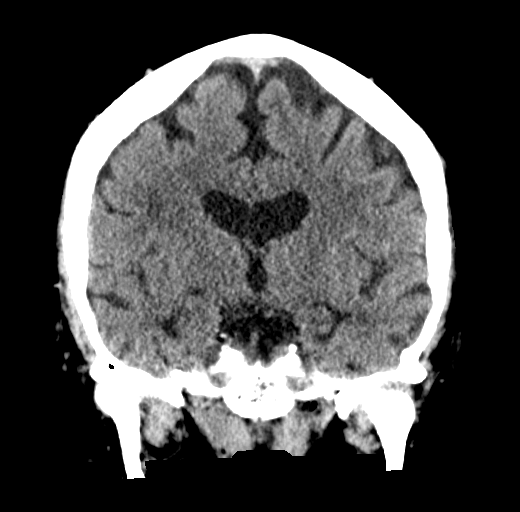

[Series 5: sagittal soft · sagittal · 0.39mm/px · 3 of 62 slices shown]
[im 21/62  brain]
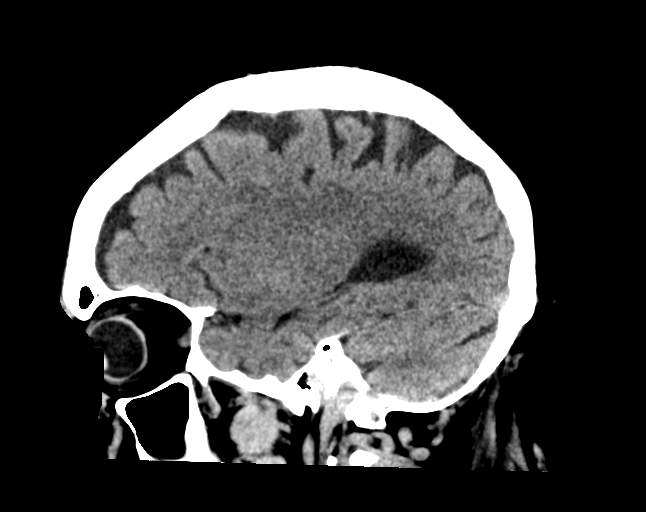
[im 31/62  brain]
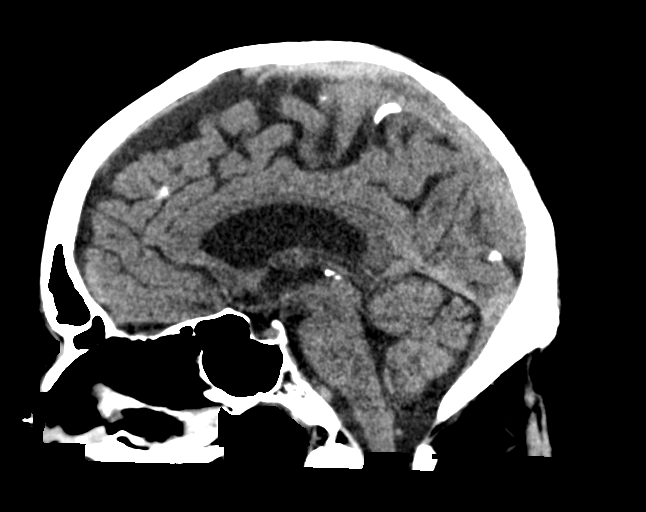
[im 41/62  brain]
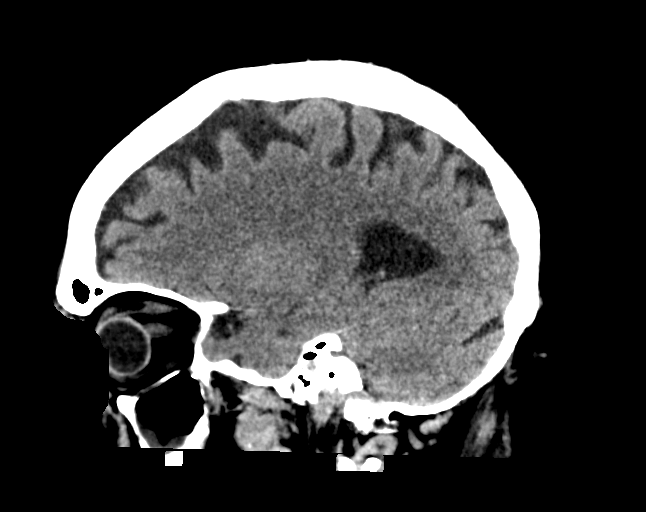

[17 of 47 positions shown; findings below may reference images not displayed]

FINDINGS: Brain: No evidence of acute infarction, hemorrhage, hydrocephalus,
extra-axial collection or mass lesion/mass effect.

Vascular: No hyperdense vessel or unexpected calcification.

Skull: Normal. Negative for fracture or focal lesion.

Sinuses/Orbits: No acute finding.

Other: None.
IMPRESSION: No acute intracranial abnormality seen.

## 2023-11-18 DIAGNOSIS — Z1339 Encounter for screening examination for other mental health and behavioral disorders: Secondary | ICD-10-CM | POA: Diagnosis not present

## 2023-11-18 DIAGNOSIS — I10 Essential (primary) hypertension: Secondary | ICD-10-CM | POA: Diagnosis not present

## 2023-11-18 DIAGNOSIS — K219 Gastro-esophageal reflux disease without esophagitis: Secondary | ICD-10-CM | POA: Diagnosis not present

## 2023-11-18 DIAGNOSIS — Z1331 Encounter for screening for depression: Secondary | ICD-10-CM | POA: Diagnosis not present

## 2023-11-18 DIAGNOSIS — M109 Gout, unspecified: Secondary | ICD-10-CM | POA: Diagnosis not present

## 2023-11-18 DIAGNOSIS — N529 Male erectile dysfunction, unspecified: Secondary | ICD-10-CM | POA: Diagnosis not present

## 2023-11-18 DIAGNOSIS — Z0000 Encounter for general adult medical examination without abnormal findings: Secondary | ICD-10-CM | POA: Diagnosis not present

## 2023-11-18 DIAGNOSIS — G4700 Insomnia, unspecified: Secondary | ICD-10-CM | POA: Diagnosis not present

## 2024-01-02 DIAGNOSIS — Z125 Encounter for screening for malignant neoplasm of prostate: Secondary | ICD-10-CM | POA: Diagnosis not present

## 2024-01-02 DIAGNOSIS — I509 Heart failure, unspecified: Secondary | ICD-10-CM | POA: Diagnosis not present

## 2024-01-02 DIAGNOSIS — I10 Essential (primary) hypertension: Secondary | ICD-10-CM | POA: Diagnosis not present

## 2024-03-05 NOTE — Progress Notes (Unsigned)
 " Cardiology Office Note:   Date:  03/05/2024  ID:  Frank Foster, DOB 02-06-1945, MRN [id number] PCP: Frank Almarie SAUNDERS, MD  [facility] Providers Cardiologist:  Frank Emmer, MD    History of Present Illness:     History of Present Illness   Frank Foster is a 79 year old male with coronary artery disease and dilated cardiomyopathy, HFrecEF who presents for follow-up   He has a history of coronary artery disease and dilated cardiomyopathy, initially referred to cardiology in 2019 due to coronary calcifications with a calcium  score of 205. An ultrasound at that time showed a moderate reduction in left ventricular ejection fraction, and a stress test confirmed reduced EF with generalized hypokinesis. A left heart catheterization revealed mild nonobstructive coronary artery disease. No chest pain since his last visit and no issues with physical activity, attributing inactivity to lack of motivation and cold weather. No swelling in his legs, difficulty lying flat, or needing to prop himself up to sleep.  End of January 2026 he developed symptoms of pneumonia, starting on a Wednesday. He was prescribed a Z-Pak on Friday, which was ineffective, and subsequently received an antibiotic injection the following Thursday, which resolved his symptoms. His breathing has returned to normal post-pneumonia.  He takes Plavix  daily without issues such as bleeding, bloody urine, or stools. He also takes Entresto  twice daily and Crestor  without experiencing cramping or other side effects. His blood pressure at home is consistent with today's reading of 120/70.  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 His diet is rich in vegetables, chicken, and fish, with limited red meat and fried foods. While patient is unclear on what prompted GP to request cardiology appointment, he mentions that they discussed episodes of morning sweating and  he speculates this may have been what prompted today's visit. Per patient, this has been experiencing morning sweating after showering for several years, which he considers normal. No associated chest pain or shortness of breath.   ***     ROS Summary: Today patient denies chest pain, shortness of breath, lower extremity edema, fatigue, palpitations, melena, hematuria, hemoptysis, diaphoresis, weakness, presyncope, syncope, orthopnea, and PND.   Studies Reviewed:    11/17/22 TTE  IMPRESSIONS     1. Left ventricular ejection fraction, by estimation, is 50 to 55%. The  left ventricle has low normal function. The left ventricle has no regional  wall motion abnormalities. Left ventricular diastolic parameters are  consistent with Grade I diastolic  dysfunction (impaired relaxation).   2. Right ventricular systolic function is normal. The right ventricular  size is normal. Tricuspid regurgitation signal is inadequate for assessing  PA pressure.   3. The mitral valve is normal in structure. Trivial mitral valve  regurgitation. No evidence of mitral stenosis.   4. The aortic valve is tricuspid. Aortic valve regurgitation is not  visualized. No aortic stenosis is present.   5. The inferior vena cava is normal in size with greater than 50%  respiratory variability, suggesting right atrial pressure of 3 mmHg.   6. Agitated saline contrast bubble study was negative, with no evidence  of any interatrial shunt.   Comparison(s): No significant change from prior study.   FINDINGS   Left Ventricle: Left ventricular ejection fraction, by estimation, is 50  to 55%. The left ventricle has low normal function. The left ventricle has  no regional wall motion abnormalities. The  left ventricular internal  cavity size was normal in size.  There is no left ventricular hypertrophy. Abnormal (paradoxical) septal  motion, consistent with left bundle branch block. Left ventricular  diastolic parameters  are consistent with Grade I diastolic dysfunction  (impaired relaxation). Normal left  ventricular filling pressure.   Right Ventricle: The right ventricular size is normal. No increase in  right ventricular wall thickness. Right ventricular systolic function is  normal. Tricuspid regurgitation signal is inadequate for assessing PA  pressure.   Left Atrium: Left atrial size was normal in size.   Right Atrium: Right atrial size was normal in size.   Pericardium: There is no evidence of pericardial effusion.   Mitral Valve: The mitral valve is normal in structure. Trivial mitral  valve regurgitation. No evidence of mitral valve stenosis. MV peak  gradient, 4.2 mmHg. The mean mitral valve gradient is 2.0 mmHg.   Tricuspid Valve: The tricuspid valve is not well visualized. Tricuspid  valve regurgitation is not demonstrated. No evidence of tricuspid  stenosis.   Aortic Valve: The aortic valve is tricuspid. Aortic valve regurgitation is  not visualized. No aortic stenosis is present.   Pulmonic Valve: The pulmonic valve was not well visualized. Pulmonic valve  regurgitation is trivial. No evidence of pulmonic stenosis.   Aorta: The aortic root and ascending aorta are structurally normal, with  no evidence of dilitation.   Venous: The inferior vena cava is normal in size with greater than 50%  respiratory variability, suggesting right atrial pressure of 3 mmHg.   IAS/Shunts: No atrial level shunt detected by color flow Doppler. Agitated  saline contrast was given intravenously to evaluate for intracardiac  shunting. Agitated saline contrast bubble study was negative, with no  evidence of any interatrial shunt.   Risk Assessment/Calculations:     No BP recorded.  {Refresh Note OR Click here to enter BP  :1}***        Physical Exam:   VS:  There were no vitals taken for this visit.   Wt Readings from Last 3 Encounters:  03/20/23 201 lb 12.8 oz (91.5 kg)  12/20/22 205 lb (93  kg)  11/17/22 208 lb 14.4 oz (94.8 kg)       Physical Exam     Affect appropriate Healthy:  appears stated age HEENT: normal Neck supple with no adenopathy JVP normal no bruits no thyromegaly Lungs clear with no wheezing and good diaphragmatic motion Heart:  S1/S2 no murmur, no rub, gallop or click PMI normal Abdomen: benighn, BS positve, no tenderness, no AAA no bruit.  No HSM or HJR Distal pulses intact with no bruits No edema Neuro non-focal Skin warm and dry No muscular weakness       ASSESSMENT AND PLAN:     Assessment and Plan    Coronary Artery Disease (CAD) Hyperlipidemia History of nonobstructive CAD with coronary calcifications on CT and calcium  score of 205. Non-obstructive disease on LHC. No new symptoms of chest pain. LDL is well controlled at 33. -Continue current management with Plavix  75mg  and Crestor  5mg .  Dilated Cardiomyopathy HFrecEF Patient previously with LVEF 40% in 2019. As above, non-obstructive CAD. Has since had improvement in ejection fraction from 40% to 50-55% per 2024 TTE. No new symptoms of heart failure such as leg swelling, exertional limitations, or orthopnea. -Continue current management with Carvedilol  12.5mg  BID and Entresto  97-103mg  BID.  Hypertension BP well controlled, 120/57mmHg today. -Continue Coreg  12.5mg  BID and Entresto  97-103mg  BID.   Pneumonia 01/2024  Resolved  with antibiotics. No residual symptoms or concerns. -No further action required at this time.  LBBB Longstanding LBBB. No symptoms suggestive of heart block/bradycardia.   ***  Signed, Frank Emmer, MD   "

## 2024-03-18 ENCOUNTER — Ambulatory Visit: Admitting: Cardiovascular Disease
